# Patient Record
Sex: Female | Born: 1960 | Race: Black or African American | Hispanic: No | Marital: Single | Smoking: Never smoker
Health system: Southern US, Community
[De-identification: ages and names within clinical notes are randomized; demographics above are authoritative.]

## PROBLEM LIST (undated history)

## (undated) DIAGNOSIS — J302 Other seasonal allergic rhinitis: Secondary | ICD-10-CM

## (undated) DIAGNOSIS — J449 Chronic obstructive pulmonary disease, unspecified: Secondary | ICD-10-CM

## (undated) DIAGNOSIS — R7303 Prediabetes: Secondary | ICD-10-CM

## (undated) DIAGNOSIS — K439 Ventral hernia without obstruction or gangrene: Secondary | ICD-10-CM

## (undated) DIAGNOSIS — I10 Essential (primary) hypertension: Secondary | ICD-10-CM

## (undated) HISTORY — PX: BREAST EXCISIONAL BIOPSY: SUR124

---

## 2013-09-16 ENCOUNTER — Emergency Department (HOSPITAL_COMMUNITY): Payer: No Typology Code available for payment source

## 2013-09-16 ENCOUNTER — Emergency Department (HOSPITAL_COMMUNITY)
Admission: EM | Admit: 2013-09-16 | Discharge: 2013-09-16 | Disposition: A | Discharge status: Home / Self Care | Payer: No Typology Code available for payment source | Attending: Emergency Medicine | Admitting: Emergency Medicine

## 2013-09-16 ENCOUNTER — Encounter (HOSPITAL_COMMUNITY): Payer: Self-pay | Admitting: Emergency Medicine

## 2013-09-16 DIAGNOSIS — Y9389 Activity, other specified: Secondary | ICD-10-CM | POA: Insufficient documentation

## 2013-09-16 DIAGNOSIS — M549 Dorsalgia, unspecified: Secondary | ICD-10-CM

## 2013-09-16 DIAGNOSIS — S46909A Unspecified injury of unspecified muscle, fascia and tendon at shoulder and upper arm level, unspecified arm, initial encounter: Secondary | ICD-10-CM | POA: Insufficient documentation

## 2013-09-16 DIAGNOSIS — Z79899 Other long term (current) drug therapy: Secondary | ICD-10-CM | POA: Insufficient documentation

## 2013-09-16 DIAGNOSIS — M25511 Pain in right shoulder: Secondary | ICD-10-CM

## 2013-09-16 DIAGNOSIS — S4980XA Other specified injuries of shoulder and upper arm, unspecified arm, initial encounter: Secondary | ICD-10-CM | POA: Insufficient documentation

## 2013-09-16 DIAGNOSIS — Y92410 Unspecified street and highway as the place of occurrence of the external cause: Secondary | ICD-10-CM | POA: Insufficient documentation

## 2013-09-16 DIAGNOSIS — IMO0002 Reserved for concepts with insufficient information to code with codable children: Secondary | ICD-10-CM | POA: Insufficient documentation

## 2013-09-16 MED ORDER — DIAZEPAM 5 MG PO TABS: 5.0000 mg | ORAL_TABLET | Freq: Two times a day (BID) | ORAL | Status: DC

## 2013-09-16 MED ORDER — IBUPROFEN 800 MG PO TABS
800.0000 mg | ORAL_TABLET | Freq: Once | ORAL | Status: AC
  Administered 2013-09-16: 800 mg via ORAL
  Filled 2013-09-16: qty 1

## 2013-09-16 MED ORDER — DIAZEPAM 5 MG PO TABS
5.0000 mg | ORAL_TABLET | Freq: Once | ORAL | Status: AC
  Administered 2013-09-16: 5 mg via ORAL
  Filled 2013-09-16: qty 1
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
## 2013-09-17 NOTE — ED Provider Notes (Signed)
Medical screening examination/treatment/procedure(s) were performed by non-physician practitioner and as supervising physician I was immediately available for consultation/collaboration.  EKG Interpretation   None         Junius Argyle, MD 09/17/13 [phone]

## 2014-02-22 ENCOUNTER — Emergency Department: Payer: Self-pay | Admitting: Emergency Medicine

## 2014-11-18 ENCOUNTER — Emergency Department: Payer: Self-pay | Admitting: Emergency Medicine

## 2014-11-18 LAB — BASIC METABOLIC PANEL
Anion Gap: 5 — ABNORMAL LOW (ref 7–16)
BUN: 10 mg/dL (ref 7–18)
Calcium, Total: 9.3 mg/dL (ref 8.5–10.1)
Chloride: 103 mmol/L (ref 98–107)
Co2: 30 mmol/L (ref 21–32)
Creatinine: 0.78 mg/dL (ref 0.60–1.30)
EGFR (African American): >60
Glucose: 112 mg/dL — ABNORMAL HIGH (ref 65–99)
OSMOLALITY: 275 (ref 275–301)
Potassium: 3.5 mmol/L (ref 3.5–5.1)
SODIUM: 138 mmol/L (ref 136–145)

## 2014-11-18 LAB — CBC
HCT: 40.5 % (ref 35.0–47.0)
HGB: 13.6 g/dL (ref 12.0–16.0)
MCH: 30.2 pg (ref 26.0–34.0)
MCHC: 33.5 g/dL (ref 32.0–36.0)
MCV: 90 fL (ref 80–100)
PLATELETS: 360 10*3/uL (ref 150–440)
RBC: 4.5 10*6/uL (ref 3.80–5.20)
RDW: 13.4 % (ref 11.5–14.5)
WBC: 8.9 10*3/uL (ref 3.6–11.0)

## 2014-11-18 LAB — TROPONIN I

## 2015-04-26 ENCOUNTER — Emergency Department
Admission: EM | Admit: 2015-04-26 | Discharge: 2015-04-26 | Disposition: A | Discharge status: Home / Self Care | Payer: Self-pay | Attending: Emergency Medicine | Admitting: Emergency Medicine

## 2015-04-26 ENCOUNTER — Other Ambulatory Visit: Payer: Self-pay

## 2015-04-26 ENCOUNTER — Encounter: Payer: Self-pay | Admitting: Emergency Medicine

## 2015-04-26 DIAGNOSIS — I10 Essential (primary) hypertension: Secondary | ICD-10-CM | POA: Insufficient documentation

## 2015-04-26 DIAGNOSIS — J069 Acute upper respiratory infection, unspecified: Secondary | ICD-10-CM | POA: Insufficient documentation

## 2015-04-26 DIAGNOSIS — Z79899 Other long term (current) drug therapy: Secondary | ICD-10-CM | POA: Insufficient documentation

## 2015-04-26 LAB — CBC
HCT: 42 % (ref 35.0–47.0)
Hemoglobin: 14 g/dL (ref 12.0–16.0)
MCH: 30 pg (ref 26.0–34.0)
MCHC: 33.3 g/dL (ref 32.0–36.0)
MCV: 90 fL (ref 80.0–100.0)
PLATELETS: 351 10*3/uL (ref 150–440)
RBC: 4.66 MIL/uL (ref 3.80–5.20)
RDW: 13.5 % (ref 11.5–14.5)
WBC: 7.4 10*3/uL (ref 3.6–11.0)

## 2015-04-26 LAB — BASIC METABOLIC PANEL
Anion gap: 7 (ref 5–15)
BUN: 10 mg/dL (ref 6–20)
CHLORIDE: 104 mmol/L (ref 101–111)
CO2: 28 mmol/L (ref 22–32)
CREATININE: 0.72 mg/dL (ref 0.44–1.00)
Calcium: 9.5 mg/dL (ref 8.9–10.3)
GLUCOSE: 110 mg/dL — AB (ref 65–99)
POTASSIUM: 4.1 mmol/L (ref 3.5–5.1)
SODIUM: 139 mmol/L (ref 135–145)

## 2015-04-26 LAB — TROPONIN I

## 2015-04-26 MED ORDER — FLUTICASONE PROPIONATE 50 MCG/ACT NA SUSP: 2.0000 | Freq: Every day | NASAL | Status: DC

## 2015-04-26 MED ORDER — HYDROCHLOROTHIAZIDE 12.5 MG PO CAPS
12.5000 mg | ORAL_CAPSULE | Freq: Once | ORAL | Status: AC
Start: 2015-04-26 — End: 2015-04-26
  Administered 2015-04-26: 12.5 mg via ORAL
  Filled 2015-04-26: qty 1

## 2015-04-26 MED ORDER — HYDROCHLOROTHIAZIDE 12.5 MG PO TABS: 12.5000 mg | ORAL_TABLET | Freq: Every day | ORAL | Status: DC

## 2015-04-26 NOTE — ED Notes (Signed)
Pt states at work today while standing she got dizzy.  Also c/o sneezing a lot today and dry non productive cough.

## 2015-04-26 NOTE — ED Notes (Signed)
Lab notified to add troponin to blood work sent.

## 2015-04-26 NOTE — ED Notes (Signed)
Pt presents with dizziness started this am around 8am while she was at work. Pt denies any pain at this time.

## 2015-04-26 NOTE — ED Provider Notes (Signed)
[facility] Emergency Department Provider Note  ____________________________________________  Time seen: Approximately 2 PM  I have reviewed the triage vital signs and the nursing notes.   HISTORY  Chief Complaint Dizziness    HPI Colleen CanLeaverta Degregorio is a 54 y.o. female without previous medical history who presents with weakness all over and a runny nose starting at about 8 AM this morning. She said she did not sleep well last night and then woke up with worsening of her runny nose. She says what works standing up she became weak but not dizzy. Despite this documented in the triage notes the patient denies any dizziness but said she had to sit herself down to prevent herself from passing out. She denies any chest pain or shortness of breath at this time. No nausea or vomiting. Says she did not eat any breakfast before work this morning but that is typical for her. She said the episode lasted about an hour until she got home.Did have one episode of diaphoresis with this which also has resolved. Says that she feels much better right now has been ambulatory without any dizziness or weakness. Does not have a primary care doctor in the last time she was in the emergency department her blood pressure was up. Not taking any blood pressure medications.   History reviewed. No pertinent past medical history.  There are no active problems to display for this patient.   History reviewed. No pertinent past surgical history.  Current Outpatient Rx  Name  Route  Sig  Dispense  Refill  . fluticasone (FLONASE) 50 MCG/ACT nasal spray   Each Nare   Place 2 sprays into both nostrils daily.   9.9 g   0   . hydrochlorothiazide (HYDRODIURIL) 12.5 MG tablet   Oral   Take 1 tablet (12.5 mg total) by mouth daily.   30 tablet   0     Allergies Review of patient's allergies indicates no known allergies.  No family history on file.  Social History History  Substance Use Topics   . Smoking status: Never Smoker   . Smokeless tobacco: Not on file  . Alcohol Use: No    Review of Systems Constitutional: No fever/chills Eyes: No visual changes. ENT: No sore throat. Cardiovascular: Denies chest pain. Respiratory: Denies shortness of breath. Gastrointestinal: No abdominal pain.  No nausea, no vomiting.  No diarrhea.  No constipation. Genitourinary: Negative for dysuria. Musculoskeletal: Negative for back pain. Skin: Negative for rash. Neurological: Negative for headaches, focal weakness or numbness.  10-point ROS otherwise negative.  ____________________________________________   PHYSICAL EXAM:  VITAL SIGNS: ED Triage Vitals  Enc Vitals Group     BP 04/26/15 1033 192/89 mmHg     Pulse Rate 04/26/15 1033 78     Resp 04/26/15 1033 18     Temp 04/26/15 1033 98 F (36.7 C)     Temp Source 04/26/15 1033 Oral     SpO2 04/26/15 1033 100 %     Weight 04/26/15 1033 165 lb (74.844 kg)     Height 04/26/15 1033 5\' 4"  (1.626 m)     Head Cir --      Peak Flow --      Pain Score 04/26/15 1034 0     Pain Loc --      Pain Edu? --      Excl. in GC? --     Constitutional: Alert and oriented. Well appearing and in no acute distress. Eyes: Conjunctivae are normal. PERRL.  EOMI. Head: Atraumatic. Nose: Bilateral rhinorrhea with swollen and erythematous mucosa. No tenderness palpation over the sinuses. Mouth/Throat: Mucous membranes are moist.  Oropharynx non-erythematous. Neck: No stridor.   Cardiovascular: Normal rate, regular rhythm. Grossly normal heart sounds.  Good peripheral circulation. Respiratory: Normal respiratory effort.  No retractions. Lungs CTAB. Gastrointestinal: Soft and nontender. No distention. No abdominal bruits. No CVA tenderness. Musculoskeletal: No lower extremity tenderness nor edema.  No joint effusions. Neurologic:  Normal speech and language. No gross focal neurologic deficits are appreciated. No gait instability. Skin:  Skin is warm,  dry and intact. No rash noted. Psychiatric: Mood and affect are normal. Speech and behavior are normal.  ____________________________________________   LABS (all labs ordered are listed, but only abnormal results are displayed)  Labs Reviewed  BASIC METABOLIC PANEL - Abnormal; Notable for the following:    Glucose, Bld 110 (*)    All other components within normal limits  CBC  TROPONIN I   ____________________________________________  EKG  ED ECG REPORT I, Arelia Longest, the attending physician, personally viewed and interpreted this ECG.   Date: 04/26/2015  EKG Time: 1036  Rate: 80  Rhythm: normal sinus rhythm  Axis: Normal axis  Intervals:none  ST&T Change: No ST elevations or depressions. No abnormal T-wave inversions.  ____________________________________________  RADIOLOGY   ____________________________________________   PROCEDURES    ____________________________________________   INITIAL IMPRESSION / ASSESSMENT AND PLAN / ED COURSE  Pertinent labs & imaging results that were available during my care of the patient were reviewed by me and considered in my medical decision making (see chart for details).  ----------------------------------------- 9:35 PM on 04/26/2015 -----------------------------------------  Patient remained at baseline neurologic status throughout hospital stay. Feel that her presentation is likely secondary to having an upper respiratory infection however, she appears to have chronic hypertension without follow-up with a primary care doctor. I will start her on HCTZ has now patient. I counseled both her and her family that she will need to follow-up for repeat blood pressure measurements to determine the efficacy of her blood pressure medication. She will likely need further adjustments and possible additional medications in the future. Her workup was reassuring with normal kidney function as well as an undetectable troponin. She  denied any dizziness and therefore it is unlikely that this represents an acute CVA especially with the patient's respiratory symptoms. We will treat symptomatically and discharged home. ____________________________________________   FINAL CLINICAL IMPRESSION(S) / ED DIAGNOSES  Final diagnoses:  Uncontrolled hypertension  Upper respiratory infection      Myrna Blazer, MD 04/26/15 2137

## 2016-02-02 ENCOUNTER — Encounter: Payer: Self-pay | Admitting: Emergency Medicine

## 2016-02-02 ENCOUNTER — Ambulatory Visit
Admission: EM | Admit: 2016-02-02 | Discharge: 2016-02-02 | Disposition: A | Discharge status: Home / Self Care | Payer: Self-pay | Attending: Family Medicine | Admitting: Family Medicine

## 2016-02-02 ENCOUNTER — Ambulatory Visit (INDEPENDENT_AMBULATORY_CARE_PROVIDER_SITE_OTHER): Payer: Self-pay

## 2016-02-02 DIAGNOSIS — M109 Gout, unspecified: Secondary | ICD-10-CM

## 2016-02-02 DIAGNOSIS — M10072 Idiopathic gout, left ankle and foot: Secondary | ICD-10-CM

## 2016-02-02 DIAGNOSIS — I10 Essential (primary) hypertension: Secondary | ICD-10-CM

## 2016-02-02 MED ORDER — NAPROXEN 500 MG PO TABS: 500.0000 mg | ORAL_TABLET | Freq: Two times a day (BID) | ORAL | Status: DC

## 2016-02-02 MED ORDER — OMEPRAZOLE 20 MG PO CPDR: 20.0000 mg | DELAYED_RELEASE_CAPSULE | Freq: Every day | ORAL | Status: DC

## 2016-02-02 NOTE — Discharge Instructions (Signed)
Gout Gout is when your joints become red, sore, and swell (inflamed). This is caused by the buildup of uric acid crystals in the joints. Uric acid is a chemical that is normally in the blood. If the level of uric acid gets too high in the blood, these crystals form in your joints and tissues. Over time, these crystals can form into masses near the joints and tissues. These masses can destroy bone and cause the bone to look misshapen (deformed). HOME CARE   Do not take aspirin for pain.  Only take medicine as told by your doctor.  Rest the joint as much as you can. When in bed, keep sheets and blankets off painful areas.  Keep the sore joints raised (elevated).  Put warm or cold packs on painful joints. Use of warm or cold packs depends on which works best for you.  Use crutches if the painful joint is in your leg.  Drink enough fluids to keep your pee (urine) clear or pale yellow. Limit alcohol, sugary drinks, and drinks with fructose in them.  Follow your diet instructions. Pay careful attention to how much protein you eat. Include fruits, vegetables, whole grains, and fat-free or low-fat milk products in your daily diet. Talk to your doctor or dietitian about the use of coffee, vitamin C, and cherries. These may help lower uric acid levels.  Keep a healthy body weight. GET HELP RIGHT AWAY IF:   You have watery poop (diarrhea), throw up (vomit), or have any side effects from medicines.  You do not feel better in 24 hours, or you are getting worse.  Your joint becomes suddenly more tender, and you have chills or a fever. MAKE SURE YOU:   Understand these instructions.  Will watch your condition.  Will get help right away if you are not doing well or get worse.   This information is not intended to replace advice given to you by your health care provider. Make sure you discuss any questions you have with your health care provider.   Document Released: 07/09/2008 Document Revised:  10/21/2014 Document Reviewed: 05/13/2012 Elsevier Interactive Patient Education 2016 [employer].  Hypertension Hypertension is another name for high blood pressure. High blood pressure forces your heart to work harder to pump blood. A blood pressure reading has two numbers, which includes a higher number over a lower number (example: 110/72). HOME CARE   Have your blood pressure rechecked by your doctor.  Only take medicine as told by your doctor. Follow the directions carefully. The medicine does not work as well if you skip doses. Skipping doses also puts you at risk for problems.  Do not smoke.  Monitor your blood pressure at home as told by your doctor. GET HELP IF:  You think you are having a reaction to the medicine you are taking.  You have repeat headaches or feel dizzy.  You have puffiness (swelling) in your ankles.  You have trouble with your vision. GET HELP RIGHT AWAY IF:   You get a very bad headache and are confused.  You feel weak, numb, or faint.  You get chest or belly (abdominal) pain.  You throw up (vomit).  You cannot breathe very well. MAKE SURE YOU:   Understand these instructions.  Will watch your condition.  Will get help right away if you are not doing well or get worse.   This information is not intended to replace advice given to you by your health care provider. Make sure you  discuss any questions you have with your health care provider.   Document Released: 03/18/2008 Document Revised: 10/05/2013 Document Reviewed: 07/23/2013 Elsevier Interactive Patient Education 2016 [employer] Inc.  Low-Purine Diet Purines are compounds that affect the level of uric acid in your body. A low-purine diet is a diet that is low in purines. Eating a low-purine diet can prevent the level of uric acid in your body from getting too high and causing gout or kidney stones or both. WHAT DO I NEED TO KNOW ABOUT THIS DIET?  Choose low-purine foods. Examples of  low-purine foods are listed in the next section.  Drink plenty of fluids, especially water. Fluids can help remove uric acid from your body. Try to drink 8-16 cups (1.9-3.8 L) a day.  Limit foods high in fat, especially saturated fat, as fat makes it harder for the body to get rid of uric acid. Foods high in saturated fat include pizza, cheese, ice cream, whole milk, fried foods, and gravies. Choose foods that are lower in fat and lean sources of protein. Use olive oil when cooking as it contains healthy fats that are not high in saturated fat.  Limit alcohol. Alcohol interferes with the elimination of uric acid from your body. If you are having a gout attack, avoid all alcohol.  Keep in mind that different people's bodies react differently to different foods. You will probably learn over time which foods do or do not affect you. If you discover that a food tends to cause your gout to flare up, avoid eating that food. You can more freely enjoy foods that do not cause problems. If you have any questions about a food item, talk to your dietitian or health care provider. WHICH FOODS ARE LOW, MODERATE, AND HIGH IN PURINES? The following is a list of foods that are low, moderate, and high in purines. You can eat any amount of the foods that are low in purines. You may be able to have small amounts of foods that are moderate in purines. Ask your health care provider how much of a food moderate in purines you can have. Avoid foods high in purines. Grains  Foods low in purines: Enriched white bread, pasta, rice, cake, cornbread, popcorn.  Foods moderate in purines: Whole-grain breads and cereals, wheat germ, bran, oatmeal. Uncooked oatmeal. Dry wheat bran or wheat germ.  Foods high in purines: Pancakes, [place] toast, biscuits, muffins. Vegetables  Foods low in purines: All vegetables, except those that are moderate in purines.  Foods moderate in purines: Asparagus, cauliflower, spinach, mushrooms,  green peas. Fruits  All fruits are low in purines. Meats and other Protein Foods  Foods low in purines: Eggs, nuts, peanut butter.  Foods moderate in purines: 80-90% lean beef, lamb, veal, pork, poultry, fish, eggs, peanut butter, nuts. Crab, lobster, oysters, and shrimp. Cooked dried beans, peas, and lentils.  Foods high in purines: Anchovies, sardines, herring, mussels, tuna, codfish, scallops, trout, and haddock. Sheila Parsons. Organ meats (such as liver or kidney). Tripe. Game meat. Goose. Sweetbreads. Dairy  All dairy foods are low in purines. Low-fat and fat-free dairy products are best because they are low in saturated fat. Beverages  Drinks low in purines: Water, carbonated beverages, tea, coffee, cocoa.  Drinks moderate in purines: Soft drinks and other drinks sweetened with high-fructose corn syrup. Juices. To find whether a food or drink is sweetened with high-fructose corn syrup, look at the ingredients list.  Drinks high in purines: Alcoholic beverages (such as beer). Condiments  Foods  low in purines: Salt, herbs, olives, pickles, relishes, vinegar.  Foods moderate in purines: Butter, margarine, oils, mayonnaise. Fats and Oils  Foods low in purines: All types, except gravies and sauces made with meat.  Foods high in purines: Gravies and sauces made with meat. Other Foods  Foods low in purines: Sugars, sweets, gelatin. Cake. Soups made without meat.  Foods moderate in purines: Meat-based or fish-based soups, broths, or bouillons. Foods and drinks sweetened with high-fructose corn syrup.  Foods high in purines: High-fat desserts (such as ice cream, cookies, cakes, pies, doughnuts, and chocolate). Contact your dietitian for more information on foods that are not listed here.   This information is not intended to replace advice given to you by your health care provider. Make sure you discuss any questions you have with your health care provider.   Document Released:  01/25/2011 Document Revised: 10/05/2013 Document Reviewed: 09/06/2013 Elsevier Interactive Patient Education 2016 [employer]  Managing Your High Blood Pressure Blood pressure is a measurement of how forceful your blood is pressing against the walls of the arteries. Arteries are muscular tubes within the circulatory system. Blood pressure does not stay the same. Blood pressure rises when you are active, excited, or nervous; and it lowers during sleep and relaxation. If the numbers measuring your blood pressure stay above normal most of the time, you are at risk for health problems. High blood pressure (hypertension) is a long-term (chronic) condition in which blood pressure is elevated. A blood pressure reading is recorded as two numbers, such as 120 over 80 (or 120/80). The first, higher number is called the systolic pressure. It is a measure of the pressure in your arteries as the heart beats. The second, lower number is called the diastolic pressure. It is a measure of the pressure in your arteries as the heart relaxes between beats.  Keeping your blood pressure in a normal range is important to your overall health and prevention of health problems, such as heart disease and stroke. When your blood pressure is uncontrolled, your heart has to work harder than normal. High blood pressure is a very common condition in adults because blood pressure tends to rise with age. Men and women are equally likely to have hypertension but at different times in life. Before age 54, men are more likely to have hypertension. After 54 years of age, women are more likely to have it. Hypertension is especially common in African Americans. This condition often has no signs or symptoms. The cause of the condition is usually not known. Your caregiver can help you come up with a plan to keep your blood pressure in a normal, healthy range. BLOOD PRESSURE STAGES Blood pressure is classified into four stages: normal,  prehypertension, stage 1, and stage 2. Your blood pressure reading will be used to determine what type of treatment, if any, is necessary. Appropriate treatment options are tied to these four stages:  Normal  Systolic pressure (mm Hg): below 120.  Diastolic pressure (mm Hg): below 80. Prehypertension  Systolic pressure (mm Hg): 120 to 139.  Diastolic pressure (mm Hg): 80 to 89. Stage1  Systolic pressure (mm Hg): 140 to 159.  Diastolic pressure (mm Hg): 90 to 99. Stage2  Systolic pressure (mm Hg): 160 or above.  Diastolic pressure (mm Hg): 100 or above. RISKS RELATED TO HIGH BLOOD PRESSURE Managing your blood pressure is an important responsibility. Uncontrolled high blood pressure can lead to:  A heart attack.  A stroke.  A weakened blood vessel (  aneurysm).  Heart failure.  Kidney damage.  Eye damage.  Metabolic syndrome.  Memory and concentration problems. HOW TO MANAGE YOUR BLOOD PRESSURE Blood pressure can be managed effectively with lifestyle changes and medicines (if needed). Your caregiver will help you come up with a plan to bring your blood pressure within a normal range. Your plan should include the following: Education  Read all information provided by your caregivers about how to control blood pressure.  Educate yourself on the latest guidelines and treatment recommendations. New research is always being done to further define the risks and treatments for high blood pressure. Lifestylechanges  Control your weight.  Avoid smoking.  Stay physically active.  Reduce the amount of salt in your diet.  Reduce stress.  Control any chronic conditions, such as high cholesterol or diabetes.  Reduce your alcohol intake. Medicines  Several medicines (antihypertensive medicines) are available, if needed, to bring blood pressure within a normal range. Communication  Review all the medicines you take with your caregiver because there may be side effects  or interactions.  Talk with your caregiver about your diet, exercise habits, and other lifestyle factors that may be contributing to high blood pressure.  See your caregiver regularly. Your caregiver can help you create and adjust your plan for managing high blood pressure. RECOMMENDATIONS FOR TREATMENT AND FOLLOW-UP  The following recommendations are based on current guidelines for managing high blood pressure in nonpregnant adults. Use these recommendations to identify the proper follow-up period or treatment option based on your blood pressure reading. You can discuss these options with your caregiver.  Systolic pressure of 120 to 139 or diastolic pressure of 80 to 89: Follow up with your caregiver as directed.  Systolic pressure of 140 to 160 or diastolic pressure of 90 to 100: Follow up with your caregiver within 2 months.  Systolic pressure above 160 or diastolic pressure above 100: Follow up with your caregiver within 1 month.  Systolic pressure above 180 or diastolic pressure above 110: Consider antihypertensive therapy; follow up with your caregiver within 1 week.  Systolic pressure above 200 or diastolic pressure above 120: Begin antihypertensive therapy; follow up with your caregiver within 1 week.   This information is not intended to replace advice given to you by your health care provider. Make sure you discuss any questions you have with your health care provider.   Document Released: 06/24/2012 Document Reviewed: 06/24/2012 Elsevier Interactive Patient Education [employer]

## 2016-02-02 NOTE — ED Provider Notes (Signed)
CSN: [id number]     Arrival date & time 02/02/16  1907 History   First MD Initiated Contact with Patient 02/02/16 2031     Chief Complaint  Patient presents with  . Ankle Pain   (Consider location/radiation/quality/duration/timing/severity/associated sxs/prior Treatment) HPI   This a 55 year old female sense with left ankle pain with redness and swelling that was noticed on Wednesday 2 days ago. Now has an antalgic gait. Did not have any injury that she remembers. She is afebrile. She has no history of gout. Have a history of hypertension which was treated for 30 days and then stopped her blood pressure today is 164/100.     History reviewed. No pertinent past medical history. History reviewed. No pertinent past surgical history. History reviewed. No pertinent family history. Social History  Substance Use Topics  . Smoking status: Never Smoker   . Smokeless tobacco: None  . Alcohol Use: No   OB History    No data available     Review of Systems  Constitutional: Positive for activity change. Negative for fever, chills and fatigue.  Musculoskeletal: Positive for joint swelling and gait problem.  Skin: Positive for color change.  All other systems reviewed and are negative.   Allergies  Review of patient's allergies indicates no known allergies.  Home Medications   Prior to Admission medications   Medication Sig Start Date End Date Taking? Authorizing Provider  fluticasone (FLONASE) 50 MCG/ACT nasal spray Place 2 sprays into both nostrils daily. 04/26/15 04/25/16  Myrna Blazeravid Matthew Schaevitz, MD  hydrochlorothiazide (HYDRODIURIL) 12.5 MG tablet Take 1 tablet (12.5 mg total) by mouth daily. 04/26/15   Myrna Blazeravid Matthew Schaevitz, MD  naproxen (NAPROSYN) 500 MG tablet Take 1 tablet (500 mg total) by mouth 2 (two) times daily with a meal. 02/02/16   Lutricia FeilWilliam P Roemer, PA-C  omeprazole (PRILOSEC) 20 MG capsule Take 1 capsule (20 mg total) by mouth daily. 02/02/16   Lutricia FeilWilliam P Roemer, PA-C    Meds Ordered and Administered this Visit  Medications - No data to display  BP 164/100 mmHg  Pulse 85  Temp(Src) 98 F (36.7 C) (Oral)  Resp 16  Ht 5\' 5"  (1.651 m)  Wt 175 lb (79.379 kg)  BMI 29.12 kg/m2  SpO2 100% No data found.   Physical Exam  Constitutional: She is oriented to person, place, and time. She appears well-developed and well-nourished. No distress.  HENT:  Head: Normocephalic and atraumatic.  Eyes: Conjunctivae are normal. Pupils are equal, round, and reactive to light.  Neck: Normal range of motion. Neck supple.  Musculoskeletal:  His admission left ankle shows a decreased range of motion. Tenderness and warmth over the medial malleolus mostly but also over the lateral malleolus yearly. There is some erythema present. Calf diameters are equal bilaterally. There is no warmth or tenderness of the calf.  Neurological: She is alert and oriented to person, place, and time.  Skin: Skin is warm and dry. She is not diaphoretic.  Psychiatric: She has a normal mood and affect. Her behavior is normal. Judgment and thought content normal.  Nursing note and vitals reviewed.   ED Course  Procedures (including critical care time)  Labs Review Labs Reviewed - No data to display  Imaging Review Dg Ankle Complete Left  02/02/2016  CLINICAL DATA:  Left lateral ankle pain for 2 days. No known injury. Pain, swelling and erythema. EXAM: LEFT ANKLE COMPLETE - 3+ VIEW COMPARISON:  None. FINDINGS: No fracture or dislocation. The alignment and joint spaces are  maintained. Ankle mortise is preserved. Well corticated fragmentation about the medial malleolus may be enthesopathy or sequela of remote prior injury. No bony destructive change. Plantar calcaneal spur and Achilles tendon enthesophyte. There is mild lateral soft tissue edema. No soft tissue air or radiopaque foreign body. IMPRESSION: Lateral soft tissue edema.  No acute bony abnormality. Electronically Signed   By: Rubye Oaks M.D.   On: 02/02/2016 21:12     Visual Acuity Review  Right Eye Distance:   Left Eye Distance:   Bilateral Distance:    Right Eye Near:   Left Eye Near:    Bilateral Near:         MDM   1. Acute gout of left ankle, unspecified cause   2. Essential hypertension    Discharge Medication List as of 02/02/2016  9:29 PM    START taking these medications   Details  naproxen (NAPROSYN) 500 MG tablet Take 1 tablet (500 mg total) by mouth 2 (two) times daily with a meal., Starting 02/02/2016, Until Discontinued, Print    omeprazole (PRILOSEC) 20 MG capsule Take 1 capsule (20 mg total) by mouth daily., Starting 02/02/2016, Until Discontinued, Print      Plan: 1. Test/x-ray results and diagnosis reviewed with patient 2. rx as per orders; risks, benefits, potential side effects reviewed with patient 3. Recommend supportive treatment with Dietary restrictions. Elevate the foot as much as possible and to protect it with crutches to keep it comfortable. He also give her Prilosec for gastric protection and reminded her that she needs to take the medication only with food. I have advised her that because of her elevated blood pressure , she'll need to come under the  care of a primary care physician. She states she will find one in the [place] area. We need to be followed up as soon as her pressures are elevated. 4. F/u prn if symptoms worsen or don't improve     Lutricia Feil, PA-C 02/02/16 2135

## 2016-02-02 NOTE — ED Notes (Signed)
Left ankle pain, redness and swelling noticed on Wed. Denied injury.

## 2016-11-21 ENCOUNTER — Inpatient Hospital Stay
Admission: EM | Admit: 2016-11-21 | Discharge: 2016-11-22 | DRG: 202 | Disposition: A | Discharge status: Home / Self Care | Payer: Self-pay | Attending: Internal Medicine | Admitting: Internal Medicine

## 2016-11-21 ENCOUNTER — Emergency Department: Payer: Self-pay

## 2016-11-21 ENCOUNTER — Encounter: Payer: Self-pay | Admitting: Emergency Medicine

## 2016-11-21 ENCOUNTER — Inpatient Hospital Stay
Admit: 2016-11-21 | Discharge: 2016-11-21 | Disposition: A | Discharge status: Home / Self Care | Payer: Self-pay | Attending: Internal Medicine | Admitting: Internal Medicine

## 2016-11-21 DIAGNOSIS — Z7951 Long term (current) use of inhaled steroids: Secondary | ICD-10-CM

## 2016-11-21 DIAGNOSIS — Z86718 Personal history of other venous thrombosis and embolism: Secondary | ICD-10-CM

## 2016-11-21 DIAGNOSIS — Z825 Family history of asthma and other chronic lower respiratory diseases: Secondary | ICD-10-CM

## 2016-11-21 DIAGNOSIS — J209 Acute bronchitis, unspecified: Principal | ICD-10-CM | POA: Diagnosis present

## 2016-11-21 DIAGNOSIS — I10 Essential (primary) hypertension: Secondary | ICD-10-CM | POA: Diagnosis present

## 2016-11-21 DIAGNOSIS — R062 Wheezing: Secondary | ICD-10-CM

## 2016-11-21 DIAGNOSIS — J441 Chronic obstructive pulmonary disease with (acute) exacerbation: Secondary | ICD-10-CM | POA: Diagnosis present

## 2016-11-21 DIAGNOSIS — J440 Chronic obstructive pulmonary disease with acute lower respiratory infection: Secondary | ICD-10-CM | POA: Diagnosis present

## 2016-11-21 DIAGNOSIS — Z79899 Other long term (current) drug therapy: Secondary | ICD-10-CM

## 2016-11-21 LAB — CBC WITH DIFFERENTIAL/PLATELET
Basophils Absolute: 0 10*3/uL (ref 0–0.1)
Basophils Relative: 0 %
Eosinophils Absolute: 0.2 10*3/uL (ref 0–0.7)
Eosinophils Relative: 2 %
HEMATOCRIT: 42.3 % (ref 35.0–47.0)
HEMOGLOBIN: 14.3 g/dL (ref 12.0–16.0)
Lymphocytes Relative: 5 %
Lymphs Abs: 0.7 10*3/uL — ABNORMAL LOW (ref 1.0–3.6)
MCH: 30.4 pg (ref 26.0–34.0)
MCHC: 33.9 g/dL (ref 32.0–36.0)
MCV: 89.8 fL (ref 80.0–100.0)
MONOS PCT: 3 %
Monocytes Absolute: 0.4 10*3/uL (ref 0.2–0.9)
NEUTROS ABS: 10.9 10*3/uL — AB (ref 1.4–6.5)
Neutrophils Relative %: 90 %
Platelets: 351 10*3/uL (ref 150–440)
RBC: 4.71 MIL/uL (ref 3.80–5.20)
RDW: 13.2 % (ref 11.5–14.5)
WBC: 12.2 10*3/uL — ABNORMAL HIGH (ref 3.6–11.0)

## 2016-11-21 LAB — BASIC METABOLIC PANEL
Anion gap: 8 (ref 5–15)
BUN: 9 mg/dL (ref 6–20)
CALCIUM: 9.4 mg/dL (ref 8.9–10.3)
CHLORIDE: 103 mmol/L (ref 101–111)
CO2: 26 mmol/L (ref 22–32)
Creatinine, Ser: 0.72 mg/dL (ref 0.44–1.00)
GFR calc non Af Amer: >60 mL/min (ref >60)
Glucose, Bld: 125 mg/dL — ABNORMAL HIGH (ref 65–99)
Potassium: 3.7 mmol/L (ref 3.5–5.1)
Sodium: 137 mmol/L (ref 135–145)

## 2016-11-21 LAB — TSH: TSH: 1.36 u[IU]/mL (ref 0.350–4.500)

## 2016-11-21 LAB — INFLUENZA PANEL BY PCR (TYPE A & B)
INFLBPCR: NEGATIVE
Influenza A By PCR: NEGATIVE

## 2016-11-21 LAB — MAGNESIUM: Magnesium: 2 mg/dL (ref 1.7–2.4)

## 2016-11-21 LAB — TROPONIN I: Troponin I: <0.03 ng/mL (ref <0.03)

## 2016-11-21 LAB — BRAIN NATRIURETIC PEPTIDE: B NATRIURETIC PEPTIDE 5: 26 pg/mL (ref 0.0–100.0)

## 2016-11-21 MED ORDER — IPRATROPIUM-ALBUTEROL 0.5-2.5 (3) MG/3ML IN SOLN
3.0000 mL | Freq: Four times a day (QID) | RESPIRATORY_TRACT | Status: DC
  Administered 2016-11-21 – 2016-11-22 (×3): 3 mL via RESPIRATORY_TRACT
  Filled 2016-11-21 (×3): qty 3

## 2016-11-21 MED ORDER — METOPROLOL TARTRATE 25 MG PO TABS
25.0000 mg | ORAL_TABLET | Freq: Two times a day (BID) | ORAL | Status: DC
  Administered 2016-11-21: 25 mg via ORAL
  Filled 2016-11-21: qty 1

## 2016-11-21 MED ORDER — DIPHENHYDRAMINE HCL 25 MG PO CAPS
25.0000 mg | ORAL_CAPSULE | Freq: Four times a day (QID) | ORAL | Status: DC | PRN
  Administered 2016-11-21: 25 mg via ORAL
  Filled 2016-11-21: qty 1

## 2016-11-21 MED ORDER — ONDANSETRON HCL 4 MG PO TABS: 4.0000 mg | ORAL_TABLET | Freq: Four times a day (QID) | ORAL | Status: DC | PRN

## 2016-11-21 MED ORDER — PANTOPRAZOLE SODIUM 40 MG PO TBEC
40.0000 mg | DELAYED_RELEASE_TABLET | Freq: Every day | ORAL | Status: DC
  Administered 2016-11-22: 40 mg via ORAL
  Filled 2016-11-21: qty 1

## 2016-11-21 MED ORDER — HYDROCHLOROTHIAZIDE 25 MG PO TABS
12.5000 mg | ORAL_TABLET | Freq: Every day | ORAL | Status: DC
  Administered 2016-11-21 – 2016-11-22 (×2): 12.5 mg via ORAL
  Filled 2016-11-21 (×2): qty 1

## 2016-11-21 MED ORDER — IPRATROPIUM BROMIDE 0.02 % IN SOLN
0.5000 mg | Freq: Once | RESPIRATORY_TRACT | Status: AC
  Administered 2016-11-21: 0.5 mg via RESPIRATORY_TRACT
  Filled 2016-11-21: qty 2.5

## 2016-11-21 MED ORDER — ALBUTEROL SULFATE (2.5 MG/3ML) 0.083% IN NEBU
5.0000 mg | INHALATION_SOLUTION | Freq: Once | RESPIRATORY_TRACT | Status: AC
  Administered 2016-11-21: 5 mg via RESPIRATORY_TRACT
  Filled 2016-11-21: qty 6

## 2016-11-21 MED ORDER — LEVOFLOXACIN IN D5W 750 MG/150ML IV SOLN
750.0000 mg | Freq: Once | INTRAVENOUS | Status: AC
  Administered 2016-11-21: 750 mg via INTRAVENOUS
  Filled 2016-11-21: qty 150

## 2016-11-21 MED ORDER — ONDANSETRON HCL 4 MG/2ML IJ SOLN: 4.0000 mg | Freq: Four times a day (QID) | INTRAMUSCULAR | Status: DC | PRN

## 2016-11-21 MED ORDER — METHYLPREDNISOLONE SODIUM SUCC 125 MG IJ SOLR
60.0000 mg | Freq: Four times a day (QID) | INTRAMUSCULAR | Status: DC
  Filled 2016-11-21: qty 2

## 2016-11-21 MED ORDER — SODIUM CHLORIDE 0.9 % IV BOLUS (SEPSIS)
1000.0000 mL | Freq: Once | INTRAVENOUS | Status: AC
Start: 2016-11-21 — End: 2016-11-21
  Administered 2016-11-21: 1000 mL via INTRAVENOUS

## 2016-11-21 MED ORDER — METHYLPREDNISOLONE SODIUM SUCC 125 MG IJ SOLR
60.0000 mg | Freq: Four times a day (QID) | INTRAMUSCULAR | Status: DC
  Administered 2016-11-21 – 2016-11-22 (×4): 60 mg via INTRAVENOUS
  Filled 2016-11-21 (×3): qty 2

## 2016-11-21 MED ORDER — PREDNISONE 20 MG PO TABS
60.0000 mg | ORAL_TABLET | Freq: Once | ORAL | Status: AC
  Administered 2016-11-21: 60 mg via ORAL
  Filled 2016-11-21: qty 3

## 2016-11-21 MED ORDER — ACETAMINOPHEN 650 MG RE SUPP: 650.0000 mg | Freq: Four times a day (QID) | RECTAL | Status: DC | PRN

## 2016-11-21 MED ORDER — BISACODYL 5 MG PO TBEC
5.0000 mg | DELAYED_RELEASE_TABLET | Freq: Every day | ORAL | Status: DC | PRN
  Filled 2016-11-21: qty 1

## 2016-11-21 MED ORDER — GUAIFENESIN-DM 100-10 MG/5ML PO SYRP
5.0000 mL | ORAL_SOLUTION | ORAL | Status: DC | PRN
  Administered 2016-11-21 – 2016-11-22 (×2): 5 mL via ORAL
  Filled 2016-11-21 (×2): qty 5

## 2016-11-21 MED ORDER — ENOXAPARIN SODIUM 40 MG/0.4ML ~~LOC~~ SOLN
40.0000 mg | SUBCUTANEOUS | Status: DC
  Administered 2016-11-21: 40 mg via SUBCUTANEOUS
  Filled 2016-11-21: qty 0.4

## 2016-11-21 MED ORDER — SODIUM CHLORIDE 0.9 % IV SOLN
INTRAVENOUS | Status: DC
  Administered 2016-11-21 – 2016-11-22 (×2): via INTRAVENOUS

## 2016-11-21 MED ORDER — ALBUTEROL SULFATE (2.5 MG/3ML) 0.083% IN NEBU
INHALATION_SOLUTION | RESPIRATORY_TRACT | Status: AC
  Administered 2016-11-21: 5 mg via RESPIRATORY_TRACT
  Filled 2016-11-21: qty 6

## 2016-11-21 MED ORDER — ALBUTEROL SULFATE (2.5 MG/3ML) 0.083% IN NEBU
5.0000 mg | INHALATION_SOLUTION | Freq: Once | RESPIRATORY_TRACT | Status: AC
Start: 2016-11-21 — End: 2016-11-21
  Administered 2016-11-21: 5 mg via RESPIRATORY_TRACT

## 2016-11-21 MED ORDER — ACETAMINOPHEN 325 MG PO TABS
650.0000 mg | ORAL_TABLET | Freq: Four times a day (QID) | ORAL | Status: DC | PRN
  Administered 2016-11-21: 650 mg via ORAL
  Filled 2016-11-21: qty 2

## 2016-11-21 MED ORDER — IOPAMIDOL (ISOVUE-370) INJECTION 76%
75.0000 mL | Freq: Once | INTRAVENOUS | Status: AC | PRN
  Administered 2016-11-21: 75 mL via INTRAVENOUS

## 2016-11-21 MED ORDER — DILTIAZEM HCL 60 MG PO TABS
60.0000 mg | ORAL_TABLET | Freq: Three times a day (TID) | ORAL | Status: DC
  Administered 2016-11-21 – 2016-11-22 (×4): 60 mg via ORAL
  Filled 2016-11-21 (×5): qty 1

## 2016-11-21 MED ORDER — TRAZODONE HCL 50 MG PO TABS: 25.0000 mg | ORAL_TABLET | Freq: Every evening | ORAL | Status: DC | PRN

## 2016-11-21 MED ORDER — DOCUSATE SODIUM 100 MG PO CAPS
100.0000 mg | ORAL_CAPSULE | Freq: Two times a day (BID) | ORAL | Status: DC
  Administered 2016-11-21 – 2016-11-22 (×2): 100 mg via ORAL
  Filled 2016-11-21 (×2): qty 1

## 2016-11-21 MED ORDER — METOPROLOL TARTRATE 25 MG PO TABS
25.0000 mg | ORAL_TABLET | Freq: Two times a day (BID) | ORAL | Status: DC
  Administered 2016-11-22 (×2): 25 mg via ORAL
  Filled 2016-11-21 (×2): qty 1

## 2016-11-21 NOTE — Progress Notes (Signed)
Patient admitted today from home. Complaining of shortness of breath on exertion and cough. Expiratory wheezes per auscultation. Complaining of back pain from lying in the bed in the ER all day. Medicated with tylenol with some relief. Patient has ambulated to the bathroom, but gets very short of breath after returning to bed. NSR on tele. Independent. Will continue to monitor.

## 2016-11-21 NOTE — ED Notes (Signed)
Pt given meal tray.

## 2016-11-21 NOTE — ED Triage Notes (Addendum)
Patient ambulatory to triage with steady gait, without difficulty or distress noted; pt [facility]prod [facility]; denies fever, denies pain; pt taken to room 24 by Wynona Caneshristine, RN for further evaluation

## 2016-11-21 NOTE — ED Provider Notes (Addendum)
[facility] Emergency Department Provider Note  ____________________________________________   I have reviewed the triage vital signs and the nursing notes.   HISTORY  Chief Complaint Cough    HPI Sheila Parsons is a 56 y.o. female who presents today complaining of cough and wheeze. She's been feeling 6 for 2 days. Runny nose. Had some chills but no fever. Denies any nausea or vomiting. Denies chest pain. Cough is occasionally productive. Sheila Parsons is a nonsmoker. States she has no other medical problems. She does have a history of hypertension but did not take her meds this morning   History reviewed. No pertinent past medical history.  There are no active problems to display for this patient.   History reviewed. No pertinent surgical history.  Prior to Admission medications   Medication Sig Start Date End Date Taking? Authorizing Provider  fluticasone (FLONASE) 50 MCG/ACT nasal spray Place 2 sprays into both nostrils daily. 04/26/15 04/25/16  Myrna Blazer, MD  hydrochlorothiazide (HYDRODIURIL) 12.5 MG tablet Take 1 tablet (12.5 mg total) by mouth daily. 04/26/15   Myrna Blazer, MD  naproxen (NAPROSYN) 500 MG tablet Take 1 tablet (500 mg total) by mouth 2 (two) times daily with a meal. 02/02/16   Lutricia Feil, PA-C  omeprazole (PRILOSEC) 20 MG capsule Take 1 capsule (20 mg total) by mouth daily. 02/02/16   Lutricia Feil, PA-C    Allergies Patient has no known allergies.  Family History  Problem Relation Age of Onset  . Healthy Mother   . COPD Father     Social History Social History  Substance Use Topics  . Smoking status: Never Smoker  . Smokeless tobacco: Never Used  . Alcohol use No    Review of Systems Constitutional: See history of present illness Eyes: No visual changes. ENT: Mild sore throat. No stiff neck no neck pain Cardiovascular: Denies chest pain. Respiratory: Of cough Gastrointestinal:   no vomiting.   No diarrhea.  No constipation. Genitourinary: Negative for dysuria. Musculoskeletal: Negative lower extremity swelling Skin: Negative for rash. Neurological: Negative for severe headaches, focal weakness or numbness. 10-point ROS otherwise negative.  ____________________________________________   PHYSICAL EXAM:  VITAL SIGNS: ED Triage Vitals  Enc Vitals Group     BP 11/21/16 0644 (!) 210/116     Pulse Rate 11/21/16 0644 (!) 114     Resp 11/21/16 0644 (!) 24     Temp 11/21/16 0644 98.3 F (36.8 C)     Temp Source 11/21/16 0644 Oral     SpO2 11/21/16 0644 95 %     Weight 11/21/16 0640 175 lb (79.4 kg)     Height 11/21/16 0640 5\' 3"  (1.6 m)     Head Circumference --      Peak Flow --      Pain Score --      Pain Loc --      Pain Edu? --      Excl. in GC? --     Constitutional: Alert and oriented. Well appearing and in no acute distress. Eyes: Conjunctivae are normal. PERRL. EOMI. Head: Atraumatic. Nose: Mild congestion/rhinnorhea. Mouth/Throat: Mucous membranes are moist.  Oropharynx non-erythematous. Neck: No stridor.   Nontender with no meningismus Cardiovascular: Normal rate, regular rhythm. Grossly normal heart sounds.  Good peripheral circulation. Respiratory: Normal respiratory effort.  Diffuse wheeze, no rales or rhonchi, diminished in the bases. Abdominal: Soft and nontender. No distention. No guarding no rebound Back:  There is no focal tenderness or step off.  there is no midline tenderness there are no lesions noted. there is no CVA tenderness Musculoskeletal: No lower extremity tenderness, no upper extremity tenderness. No joint effusions, no DVT signs strong distal pulses no edema Neurologic:  Normal speech and language. No gross focal neurologic deficits are appreciated.  Skin:  Skin is warm, dry and intact. No rash noted. Psychiatric: Mood and affect are normal. Speech and behavior are normal.  ____________________________________________   LABS (all labs  ordered are listed, but only abnormal results are displayed)  Labs Reviewed  INFLUENZA PANEL BY PCR (TYPE A & B)   ____________________________________________  EKG  I personally interpreted any EKGs ordered by me or triage Sinus tachycardia rate 106, total PVCs noted, no acute ST elevation, normal axis. ____________________________________________  RADIOLOGY  I reviewed any imaging ordered by me or triage that were performed during my shift and, if possible, patient and/or family made aware of any abnormal findings. ____________________________________________   PROCEDURES  Procedure(s) performed: None  Procedures  Critical Care performed: None  ____________________________________________   INITIAL IMPRESSION / ASSESSMENT AND PLAN / ED COURSE  Pertinent labs & imaging results that were available during my care of the patient were reviewed by me and considered in my medical decision making (see chart for details).  After treatment, patient is moving air better she feels better. There is still some mild wheeze and she is still diminished in the bases. She is not as tight. We will check a flu test on her and I will observe her until steroids kick in as none thing she is quite ready for discharge at this time. She does however have sats in the mid 90s and looks better. I don't think this represents ACS PE dissection or cardiogenic wheeze.   ----------------------------------------- 10:45 AM on 11/21/2016 -----------------------------------------  Blood work reassuring however when we entered the patient she becomes very tachypnea And short of breath and has to sit down. We will give her another breathing treatment. She is continuing to wheeze, she still tight in the bases again. She may require admission if she does not clear after this breathing treatment.  ----------------------------------------- 12:09 PM on 11/21/2016 -----------------------------------------  Patient  persists with cough and wheeze Workup is otherwise reassuring. However given that she is not tolerating ambulation just secondary wheeze despite multiple different nebulizers and stress we will admit her for further observation. I will give her empiric antibiotics for her chills and cough.  ----------------------------------------- 12:17 PM on 11/21/2016 -----------------------------------------  Further discussion with patient, she does recall that she had a leg clot when she was pregnant 37 years ago. This is something that she had forgotten to mention initially. Even though she is  primarily wheeze, we'll obtain CT to rule out other causes for her shortness of breath.   ____________________________________________   FINAL CLINICAL IMPRESSION(S) / ED DIAGNOSES  Final diagnoses:  None      This chart was dictated using voice recognition software.  Despite best efforts to proofread,  errors can occur which can change meaning.      Jeanmarie PlantJames A Verdine Grenfell, MD 11/21/16 [id number]    Jeanmarie PlantJames A Trezure Cronk, MD 11/21/16 1046    Jeanmarie PlantJames A Raja Liska, MD 11/21/16 1210    Jeanmarie PlantJames A Azan Maneri, MD 11/21/16 1213    Jeanmarie PlantJames A Tommye Lehenbauer, MD 11/21/16 [phone]

## 2016-11-21 NOTE — ED Notes (Signed)
Admitting MD at bedside.

## 2016-11-21 NOTE — ED Provider Notes (Signed)
EKG was performed due to new onset of mild chest pain. EKG interpreted by me Sinus tachycardia rate 103, normal axis intervals QRS ST segments and T waves. No acute ischemic changes.   Sharman CheekPhillip Errin Chewning, MD 11/21/16 [phone]

## 2016-11-21 NOTE — H&P (Signed)
[facility] [place] at [facility]   PATIENT NAME: Sheila Parsons    MR#:  [id number]  DATE OF BIRTH:  July 27, 1961  DATE OF ADMISSION:  11/21/2016  PRIMARY CARE PHYSICIAN: Default, Provider, MD   REQUESTING/REFERRING PHYSICIAN:  No pcp  CHIEF COMPLAINT: Shortness of breath    Chief Complaint  Patient presents with  . Cough    HISTORY OF PRESENT ILLNESS:  Sheila Parsons  is a 56 y.o. female withA past medical history brought in by family because of cough, shortness of breath started yesterday. No chest pain but patient has dry cough. No extremity edema. Patient heart rate went up to 130s when she got up so ER physician could not discharge the patient. Patient received series of nebulizer treatments for wheezing which helped her but still had tachycardia because of that  She is referred for admission.    History reviewed. No pertinent past medical history.  PAST SURGICAL HISTOIRY:  History reviewed. No pertinent surgical history.  SOCIAL HISTORY:   Social History  Substance Use Topics  . Smoking status: Never Smoker  . Smokeless tobacco: Never Used  . Alcohol use No    FAMILY HISTORY:   Family History  Problem Relation Age of Onset  . Healthy Mother   . COPD Father     DRUG ALLERGIES:  No Known Allergies  REVIEW OF SYSTEMS:  CONSTITUTIONAL: No fever, fatigue or weakness.  EYES: No blurred or double vision.  EARS, NOSE, AND THROAT: No tinnitus or ear pain.  RESPIRATORY: , cough, shortness of breath Cardiovascular no chest pain.   STROINTESTINAL: No nausea, vomiting, diarrhea or abdominal pain.  GENITOURINARY: No dysuria, hematuria.  ENDOCRINE: No polyuria, nocturia,  HEMATOLOGY: No anemia, easy bruising or bleeding SKIN: No rash or lesion. MUSCULOSKELETAL: No joint pain or arthritis.   NEUROLOGIC: No tingling, numbness, weakness.  PSYCHIATRY: No anxiety or depression.   MEDICATIONS AT HOME:   Prior to Admission medications    Medication Sig Start Date End Date Taking? Authorizing Provider  fluticasone (FLONASE) 50 MCG/ACT nasal spray Place 2 sprays into both nostrils daily. 04/26/15 04/25/16  Myrna Blazer, MD  hydrochlorothiazide (HYDRODIURIL) 12.5 MG tablet Take 1 tablet (12.5 mg total) by mouth daily. Patient not taking: Reported on 11/21/2016 04/26/15   Myrna Blazer, MD  naproxen (NAPROSYN) 500 MG tablet Take 1 tablet (500 mg total) by mouth 2 (two) times daily with a meal. Patient not taking: Reported on 11/21/2016 02/02/16   Lutricia Feil, PA-C  omeprazole (PRILOSEC) 20 MG capsule Take 1 capsule (20 mg total) by mouth daily. Patient not taking: Reported on 11/21/2016 02/02/16   Lutricia Feil, PA-C      VITAL SIGNS:  Blood pressure (!) 150/92, pulse (!) 111, temperature 98.3 F (36.8 C), temperature source Oral, resp. rate 20, height 5\' 3"  (1.6 m), weight 79.4 kg (175 lb), SpO2 93 %.  PHYSICAL EXAMINATION:  GENERAL:  55 y.o.-year-old patient lying in the bed with no acute distress.  EYES: Pupils equal, round, reactive to light and accommodation. No scleral icterus. Extraocular muscles intact.  HEENT: Head atraumatic, normocephalic. Oropharynx and nasopharynx clear.  NECK:  Supple, no jugular venous distention. No thyroid enlargement, no tenderness.  LUNGS: faint  expiratory wheeze bilaterally.,rhonchi or crepitation. No use of accessory muscles of respiration.  CARDIOVASCULAR: S1, S2 normal. No murmurs, rubs, or gallops.  ABDOMEN: Soft, nontender, nondistended. Bowel sounds present. No organomegaly or mass.  EXTREMITIES: No pedal edema, cyanosis, or clubbing.  NEUROLOGIC: Cranial nerves II through XII are intact. Muscle strength 5/5 in all extremities. Sensation intact. Gait not checked.  PSYCHIATRIC: The patient is alert and oriented x 3.  SKIN: No obvious rash, lesion, or ulcer.   LABORATORY PANEL:   CBC  Recent Labs Lab 11/21/16 0944  WBC 12.2*  HGB 14.3  HCT 42.3  PLT 351    ------------------------------------------------------------------------------------------------------------------  Chemistries   Recent Labs Lab 11/21/16 0944  NA 137  K 3.7  CL 103  CO2 26  GLUCOSE 125*  BUN 9  CREATININE 0.72  CALCIUM 9.4  MG 2.0   ------------------------------------------------------------------------------------------------------------------  Cardiac Enzymes  Recent Labs Lab 11/21/16 0944  TROPONINI <0.03   ------------------------------------------------------------------------------------------------------------------  RADIOLOGY:  Dg Chest 2 View  Result Date: 11/21/2016 CLINICAL DATA:  Shortness of breath for 2 days EXAM: CHEST  2 VIEW COMPARISON:  11/18/2014 FINDINGS: The heart size and mediastinal contours are within normal limits. Both lungs are clear. The visualized skeletal structures are unremarkable. IMPRESSION: No active cardiopulmonary disease. Electronically Signed   By: Alcide CleverMark  Lukens M.D.   On: 11/21/2016 08:03   Ct Angio Chest Pe W And/or Wo Contrast  Result Date: 11/21/2016 CLINICAL DATA:  Nonproductive cough and shortness of breath for several days EXAM: CT ANGIOGRAPHY CHEST WITH CONTRAST TECHNIQUE: Multidetector CT imaging of the chest was performed using the standard protocol during bolus administration of intravenous contrast. Multiplanar CT image reconstructions and MIPs were obtained to evaluate the vascular anatomy. CONTRAST:  75 mL Isovue 370 COMPARISON:  None. FINDINGS: Cardiovascular: The thoracic aorta and its branches are within normal limits. No aneurysmal dilatation or dissection is identified. The pulmonary artery shows a normal branching pattern without focal filling defect to suggest pulmonary embolism. Mild coronary calcifications are seen. The cardiac structures within normal limits. Mediastinum/Nodes: The thoracic inlet is within normal limits. No hilar or mediastinal adenopathy is seen. Lungs/Pleura: Lungs are clear. No  pleural effusion or pneumothorax. Upper Abdomen: Within normal limits. Musculoskeletal: Degenerative changes of the thoracic spine are noted. No acute bony abnormality is seen. Review of the MIP images confirms the above findings. IMPRESSION: No evidence of pulmonary emboli.  No acute abnormality noted. Electronically Signed   By: Alcide CleverMark  Lukens M.D.   On: 11/21/2016 12:54    EKG:   Orders placed or performed during the hospital encounter of 11/21/16  . EKG 12-Lead  . EKG 12-Lead   Sinus tachycardia with some PVCs 10 6 bpm no acute ST elevations. Normal axis. IMPRESSION AND PLAN:   #1 acute bronchitis with tachycardia, wheezing: Admitted to observation status for overnight, continue albuterol with Atrovent nebulizers, empiric antibiotics, IV hydration. #2 hypertension essential hypertension with a history of BP: Add metoprolol. Also check EKG.    All the records are reviewed and case discussed with ED provider. Management plans discussed with the patient, family and they are in agreement.  CODE STATUS: full  TOTAL TIME TAKING CARE OF THIS PATIENT: 55 minutes.    Katha HammingKONIDENA,Senay Sistrunk M.D on 11/21/2016 at 2:17 PM  Between 7am to 6pm - Pager - [phone]  After 6pm go to [email or web address] - password [email or web address]  [facility]  Office  [phone]  CC: Primary care physician; Default, Provider, MD  Note: This dictation was prepared with Dragon dictation along with smaller phrase technology. Any transcriptional errors that result from this process are unintentional.

## 2016-11-21 NOTE — ED Notes (Addendum)
Pt c/o of new onset of chest pain, pain of 2-3/10; pt put on cardiac monitoring per RN judgement.

## 2016-11-21 NOTE — ED Notes (Signed)
Patient transported to X-ray 

## 2016-11-21 NOTE — ED Notes (Signed)
Vitals completed while patient was ambulating.  Patient had increased work of breathing and increased expiratory wheezes present. Patient uncomfortable while walking and assisted back to the room.  MD notified.

## 2016-11-22 LAB — BASIC METABOLIC PANEL
Anion gap: 10 (ref 5–15)
BUN: 13 mg/dL (ref 6–20)
CALCIUM: 9.7 mg/dL (ref 8.9–10.3)
CO2: 23 mmol/L (ref 22–32)
Chloride: 105 mmol/L (ref 101–111)
Creatinine, Ser: 0.59 mg/dL (ref 0.44–1.00)
GFR calc Af Amer: >60 mL/min (ref >60)
GLUCOSE: 156 mg/dL — AB (ref 65–99)
POTASSIUM: 3.7 mmol/L (ref 3.5–5.1)
Sodium: 138 mmol/L (ref 135–145)

## 2016-11-22 LAB — CBC
HEMATOCRIT: 40.1 % (ref 35.0–47.0)
Hemoglobin: 13.7 g/dL (ref 12.0–16.0)
MCH: 30.8 pg (ref 26.0–34.0)
MCHC: 34.3 g/dL (ref 32.0–36.0)
MCV: 90 fL (ref 80.0–100.0)
Platelets: 350 10*3/uL (ref 150–440)
RBC: 4.46 MIL/uL (ref 3.80–5.20)
RDW: 12.9 % (ref 11.5–14.5)
WBC: 10.8 10*3/uL (ref 3.6–11.0)

## 2016-11-22 LAB — GLUCOSE, CAPILLARY: Glucose-Capillary: 152 mg/dL — ABNORMAL HIGH (ref 65–99)

## 2016-11-22 LAB — ECHOCARDIOGRAM COMPLETE
HEIGHTINCHES: 63 in
Weight: 2800 oz

## 2016-11-22 MED ORDER — ALBUTEROL SULFATE HFA 108 (90 BASE) MCG/ACT IN AERS: 2.0000 | INHALATION_SPRAY | Freq: Four times a day (QID) | RESPIRATORY_TRACT | 2 refills | Status: DC | PRN

## 2016-11-22 MED ORDER — GUAIFENESIN-DM 100-10 MG/5ML PO SYRP: 5.0000 mL | ORAL_SOLUTION | ORAL | 0 refills | Status: DC | PRN

## 2016-11-22 MED ORDER — METOPROLOL TARTRATE 25 MG PO TABS: 25.0000 mg | ORAL_TABLET | Freq: Two times a day (BID) | ORAL | 0 refills | Status: DC

## 2016-11-22 MED ORDER — AMLODIPINE BESYLATE 10 MG PO TABS: 10.0000 mg | ORAL_TABLET | Freq: Every day | ORAL | 2 refills | Status: AC

## 2016-11-22 NOTE — Progress Notes (Signed)
[facility] - [place] at [facility]        Cherrill Benjiman CoreHinton was admitted to the Hospital on 11/21/2016 and Discharged  11/22/2016 and should be excused from work/school   for 7  days starting 11/21/2016 , may return to work/school without any restrictions.  [name], Raechelle Sarti M.D on 11/22/2016,at 3:00 PM  [facility] - [place] at [facility]    Office  [phone]

## 2016-11-22 NOTE — Discharge Summary (Signed)
[facility] - [place] at [facility]   PATIENT NAME: Sheila Parsons    MR#:  [id number]  DATE OF BIRTH:  07/01/1961  DATE OF ADMISSION:  11/21/2016   ADMITTING PHYSICIAN: Katha HammingSnehalatha Konidena, MD  DATE OF DISCHARGE: 11/22/2016 PRIMARY CARE PHYSICIAN: Default, Provider, MD   ADMISSION DIAGNOSIS:  Wheeze [R06.2] DISCHARGE DIAGNOSIS:  Active Problems:   COPD with acute exacerbation (HCC) acute bronchitis with tachycardia SECONDARY DIAGNOSIS:  History reviewed. No pertinent past medical history. HOSPITAL COURSE:   #1 acute bronchitis with tachycardia, wheezing: The patient was treated with albuterol, IV Solu-Medrol, IV Levaquin and IV fluid rehydration. Her symptoms is better. Continue albuterol when necessary and Robitussin when necessary.  #2 hypertension essential hypertension with a history of BP:  continue metoprolol, add norvasc.  DISCHARGE CONDITIONS:  Stable, Discharge to home today. CONSULTS OBTAINED:   DRUG ALLERGIES:  No Known Allergies DISCHARGE MEDICATIONS:   Allergies as of 11/22/2016   No Known Allergies     Medication List    STOP taking these medications   fluticasone 50 MCG/ACT nasal spray Commonly known as:  FLONASE   naproxen 500 MG tablet Commonly known as:  NAPROSYN     TAKE these medications   albuterol 108 (90 Base) MCG/ACT inhaler Commonly known as:  PROVENTIL HFA;VENTOLIN HFA Inhale 2 puffs into the lungs every 6 (six) hours as needed for wheezing or shortness of breath.   amLODipine 10 MG tablet Commonly known as:  NORVASC Take 1 tablet (10 mg total) by mouth daily.   guaiFENesin-dextromethorphan 100-10 MG/5ML syrup Commonly known as:  ROBITUSSIN DM Take 5 mLs by mouth every 4 (four) hours as needed for cough.   hydrochlorothiazide 12.5 MG tablet Commonly known as:  HYDRODIURIL Take 1 tablet (12.5 mg total) by mouth daily.   metoprolol tartrate 25 MG tablet Commonly known as:  LOPRESSOR Take 1 tablet (25 mg  total) by mouth 2 (two) times daily. Start taking on:  11/23/2016   omeprazole 20 MG capsule Commonly known as:  PRILOSEC Take 1 capsule (20 mg total) by mouth daily.        DISCHARGE INSTRUCTIONS:  See AVS.  If you experience worsening of your admission symptoms, develop shortness of breath, life threatening emergency, suicidal or homicidal thoughts you must seek medical attention immediately by calling 911 or calling your MD immediately  if symptoms less severe.  You Must read complete instructions/literature along with all the possible adverse reactions/side effects for all the Medicines you take and that have been prescribed to you. Take any new Medicines after you have completely understood and accpet all the possible adverse reactions/side effects.   Please note  You were cared for by a hospitalist during your hospital stay. If you have any questions about your discharge medications or the care you received while you were in the hospital after you are discharged, you can call the unit and asked to speak with the hospitalist on call if the hospitalist that took care of you is not available. Once you are discharged, your primary care physician will handle any further medical issues. Please note that NO REFILLS for any discharge medications will be authorized once you are discharged, as it is imperative that you return to your primary care physician (or establish a relationship with a primary care physician if you do not have one) for your aftercare needs so that they can reassess your need for medications and monitor your lab values.    On the day of  Discharge:  VITAL SIGNS:  Blood pressure (!) 149/72, pulse 98, temperature 98.2 F (36.8 C), temperature source Oral, resp. rate 18, height 5\' 3"  (1.6 m), weight 176 lb 1.6 oz (79.9 kg), SpO2 98 %. PHYSICAL EXAMINATION:  GENERAL:  56 y.o.-year-old patient lying in the bed with no acute distress.  EYES: Pupils equal, round, reactive to  light and accommodation. No scleral icterus. Extraocular muscles intact.  HEENT: Head atraumatic, normocephalic. Oropharynx and nasopharynx clear.  NECK:  Supple, no jugular venous distention. No thyroid enlargement, no tenderness.  LUNGS: Normal breath sounds bilaterally, mild wheezing, no rales,rhonchi or crepitation. No use of accessory muscles of respiration.  CARDIOVASCULAR: S1, S2 normal. No murmurs, rubs, or gallops.  ABDOMEN: Soft, non-tender, non-distended. Bowel sounds present. No organomegaly or mass.  EXTREMITIES: No pedal edema, cyanosis, or clubbing.  NEUROLOGIC: Cranial nerves II through XII are intact. Muscle strength 5/5 in all extremities. Sensation intact. Gait not checked.  PSYCHIATRIC: The patient is alert and oriented x 3.  SKIN: No obvious rash, lesion, or ulcer.  DATA REVIEW:   CBC  Recent Labs Lab 11/22/16 0530  WBC 10.8  HGB 13.7  HCT 40.1  PLT 350    Chemistries   Recent Labs Lab 11/21/16 0944 11/22/16 0530  NA 137 138  K 3.7 3.7  CL 103 105  CO2 26 23  GLUCOSE 125* 156*  BUN 9 13  CREATININE 0.72 0.59  CALCIUM 9.4 9.7  MG 2.0  --      Microbiology Results  No results found for this or any previous visit.  RADIOLOGY:  No results found.   Management plans discussed with the patient, Her daughter and they are in agreement.  CODE STATUS:     Code Status Orders        Start     Ordered   11/21/16 1243  Full code  Continuous     11/21/16 1244    Code Status History    Date Active Date Inactive Code Status Order ID Comments User Context   This patient has a current code status but no historical code status.      TOTAL TIME TAKING CARE OF THIS PATIENT: 33 minutes.    Shaune Pollack M.D on 11/22/2016 at 2:46 PM  Between 7am to 6pm - Pager - [phone]  After 6pm go to [email or web address] - Social research officer, government  [facility]  Office  [phone]  CC: Primary care physician; Default, Provider, MD   Note:  This dictation was prepared with Dragon dictation along with smaller phrase technology. Any transcriptional errors that result from this process are unintentional.

## 2016-11-22 NOTE — Discharge Instructions (Signed)
Heart healthy diet. Exercise and diet control. Follow up PCP.

## 2016-11-22 NOTE — Plan of Care (Signed)
Problem: Health Behavior/Discharge Planning: Goal: Ability to manage health-related needs will improve Outcome: Adequate for Discharge Education and instructions regarding COPD.  Instruction and handouts for discharge medications.

## 2016-11-22 NOTE — Care Management (Signed)
Employed full time but no insurance.  Provided information/ applications on [facility]. Primary nurse will discuss discharge meds with CM in the event patient needs assitance

## 2016-11-22 NOTE — Progress Notes (Signed)
Patient is being discharged to home.  Her daughter is taking her home.  Reviewed new medications with her and her daughter, indication for use and potential side effects, when to call physician.

## 2016-11-22 NOTE — Progress Notes (Signed)
[facility] - [place] at [facility]        Sheila Parsons is with his mother who admitted to the Hospital on 11/21/2016 and Discharged  11/22/2016 and should be excused from work/school   [name], [name] M.D on 11/22/2016,at 3:01 PM  [facility] - [place] at [facility]    Office  [phone]

## 2016-11-22 NOTE — Progress Notes (Signed)
MD notified of Pt with scattered red like rash on pt's chin neck and back. Orders received. Will continue to monitor and assess.

## 2016-12-18 ENCOUNTER — Encounter: Payer: Self-pay | Admitting: *Deleted

## 2016-12-31 ENCOUNTER — Ambulatory Visit: Payer: Self-pay | Admitting: General Surgery

## 2017-01-18 ENCOUNTER — Emergency Department
Admission: EM | Admit: 2017-01-18 | Discharge: 2017-01-18 | Disposition: A | Discharge status: Home / Self Care | Payer: Self-pay | Attending: Emergency Medicine | Admitting: Emergency Medicine

## 2017-01-18 ENCOUNTER — Encounter: Payer: Self-pay | Admitting: Emergency Medicine

## 2017-01-18 ENCOUNTER — Emergency Department: Payer: Self-pay

## 2017-01-18 DIAGNOSIS — Z79899 Other long term (current) drug therapy: Secondary | ICD-10-CM | POA: Insufficient documentation

## 2017-01-18 DIAGNOSIS — J449 Chronic obstructive pulmonary disease, unspecified: Secondary | ICD-10-CM | POA: Insufficient documentation

## 2017-01-18 DIAGNOSIS — J069 Acute upper respiratory infection, unspecified: Secondary | ICD-10-CM | POA: Insufficient documentation

## 2017-01-18 DIAGNOSIS — I10 Essential (primary) hypertension: Secondary | ICD-10-CM | POA: Insufficient documentation

## 2017-01-18 HISTORY — DX: Essential (primary) hypertension: I10

## 2017-01-18 MED ORDER — PREDNISONE 10 MG PO TABS: 50.0000 mg | ORAL_TABLET | Freq: Every day | ORAL | 0 refills | Status: DC

## 2017-01-18 MED ORDER — HYDROCOD POLST-CPM POLST ER 10-8 MG/5ML PO SUER: 5.0000 mL | Freq: Two times a day (BID) | ORAL | 0 refills | Status: DC | PRN

## 2017-01-18 NOTE — ED Notes (Signed)
Pt verbalized understanding of discharge instructions. NAD at this time. 

## 2017-01-18 NOTE — ED Provider Notes (Signed)
[facility] Emergency Department Provider Note  ____________________________________________  Time seen: Approximately 5:45 PM  I have reviewed the triage vital signs and the nursing notes.   HISTORY  Chief Complaint Cough   HPI Sheila Parsons is a 56 y.o. female who presents to the emergency room for evaluation of cough that has been present for the past 4 days. Cough has been nonproductive. She denies fever. Patient is a nonsmoker. She does have a history of COPD. She has not been using her inhaler. She is also complaining of some right upper back pain, but believes it is due to the frequency of her cough.   Past Medical History:  Diagnosis Date  . Hypertension     Patient Active Problem List   Diagnosis Date Noted  . COPD with acute exacerbation (HCC) 11/21/2016    History reviewed. No pertinent surgical history.  Prior to Admission medications   Medication Sig Start Date End Date Taking? Authorizing Provider  albuterol (PROVENTIL HFA;VENTOLIN HFA) 108 (90 Base) MCG/ACT inhaler Inhale 2 puffs into the lungs every 6 (six) hours as needed for wheezing or shortness of breath. 11/22/16   Shaune Pollack, MD  amLODipine (NORVASC) 10 MG tablet Take 1 tablet (10 mg total) by mouth daily. 11/22/16   Shaune Pollack, MD  chlorpheniramine-HYDROcodone [facility] ER) 10-8 MG/5ML SUER Take 5 mLs by mouth every 12 (twelve) hours as needed for cough. 01/18/17   Berkleigh Beckles B Malka Bocek, FNP  guaiFENesin-dextromethorphan (ROBITUSSIN DM) 100-10 MG/5ML syrup Take 5 mLs by mouth every 4 (four) hours as needed for cough. 11/22/16   Shaune Pollack, MD  hydrochlorothiazide (HYDRODIURIL) 12.5 MG tablet Take 1 tablet (12.5 mg total) by mouth daily. Patient not taking: Reported on 11/21/2016 04/26/15   Myrna Blazer, MD  metoprolol tartrate (LOPRESSOR) 25 MG tablet Take 1 tablet (25 mg total) by mouth 2 (two) times daily. 11/23/16   Shaune Pollack, MD  omeprazole (PRILOSEC) 20 MG capsule Take 1  capsule (20 mg total) by mouth daily. Patient not taking: Reported on 11/21/2016 02/02/16   Lutricia Feil, PA-C  predniSONE (DELTASONE) 10 MG tablet Take 5 tablets (50 mg total) by mouth daily. 01/18/17   Chinita Pester, FNP    Allergies Patient has no known allergies.  Family History  Problem Relation Age of Onset  . Healthy Mother   . COPD Father     Social History Social History  Substance Use Topics  . Smoking status: Never Smoker  . Smokeless tobacco: Never Used  . Alcohol use No    Review of Systems Constitutional: Negative for fever/chills ENT: Negative for sore throat. Cardiovascular: Denies chest pain. Respiratory: Negative for shortness of breath. Positive for cough. Gastrointestinal: Negative for nausea,  no vomiting.  Negative for diarrhea.  Musculoskeletal: Negative for body aches Skin: Negative for rash. Neurological: Negative for headaches ____________________________________________   PHYSICAL EXAM:  VITAL SIGNS: ED Triage Vitals [01/18/17 1641]  Enc Vitals Group     BP 137/88     Pulse Rate 97     Resp 18     Temp 98.9 F (37.2 C)     Temp Source Oral     SpO2 100 %     Weight 175 lb (79.4 kg)     Height  (1.626 m)     Head Circumference      Peak Flow      Pain Score 10     Pain Loc      Pain Edu?  Excl. in GC?     Constitutional: Alert and oriented. Acutely ill appearing and in no acute distress. Eyes: Conjunctivae are normal. EOMI. Nose: No congestion noted; no rhinnorhea. Mouth/Throat: Mucous membranes are moist.  Oropharynx normal. Tonsils normal without exudate. Neck: No stridor.  Lymphatic: No cervical lymphadenopathy. Cardiovascular: Normal rate, regular rhythm. Good peripheral circulation. Respiratory: Normal respiratory effort.  No retractions. Breath sounds clear to auscultation throughout. Gastrointestinal: Soft and nontender.  Musculoskeletal: FROM x 4 extremities.  Neurologic:  Normal speech and language.  Skin:   Skin is warm, dry and intact. No rash noted. Psychiatric: Mood and affect are normal. Speech and behavior are normal.  ____________________________________________   LABS (all labs ordered are listed, but only abnormal results are displayed)  Labs Reviewed - No data to display ____________________________________________  EKG  Not indicated ____________________________________________  RADIOLOGY  Chest x-ray negative for focal consolidation, cardiomegaly or effusion per radiology. ____________________________________________   PROCEDURES  Procedure(s) performed: None  Critical Care performed: No ____________________________________________   INITIAL IMPRESSION / ASSESSMENT AND PLAN / ED COURSE  56 year old female presenting to the emergency department for 4 days of cough. She'll be treated with Tussionex and a burst dose of prednisone since she does have a history of COPD. No indication for antibiotics today. She'll be encouraged to follow up with her primary care provider for symptoms that are not improving over the next 2-3 days. She'll be advised to return to the emergency department for symptoms that change or worsen if she is unable schedule an appointment.  Pertinent labs & imaging results that were available during my care of the patient were reviewed by me and considered in my medical decision making (see chart for details).  New Prescriptions   CHLORPHENIRAMINE-HYDROCODONE (TUSSIONEX PENNKINETIC ER) 10-8 MG/5ML SUER    Take 5 mLs by mouth every 12 (twelve) hours as needed for cough.   PREDNISONE (DELTASONE) 10 MG TABLET    Take 5 tablets (50 mg total) by mouth daily.    If controlled substance prescribed during this visit, 12 month history viewed on the [facility] prior to issuing an initial prescription for Schedule II or III opiod. ____________________________________________   FINAL CLINICAL IMPRESSION(S) / ED DIAGNOSES  Final diagnoses:  Viral upper  respiratory tract infection    Note:  This document was prepared using Dragon voice recognition software and may include unintentional dictation errors.     Chinita Pester, FNP 01/18/17 1755    Merrily Brittle, MD 01/18/17 7564

## 2017-01-18 NOTE — Discharge Instructions (Signed)
Use your albuterol inhaler as prescribed. Follow-up with her primary care provider for symptoms that are not improving over the next 2-3 days. Return to the emergency department for symptoms that change or worsen if you are unable to schedule an appointment.

## 2017-01-18 NOTE — ED Triage Notes (Signed)
Cough x 3 days, states at times productive yellow, now having back pain with cough.

## 2017-02-13 ENCOUNTER — Encounter: Payer: Self-pay | Admitting: *Deleted

## 2018-01-09 IMAGING — CT CT ANGIO CHEST
1 of 6 series · 19 of 36 positions shown · IV contrast (APPLIED)
Comparison: None.

CLINICAL DATA: Nonproductive cough and shortness of breath for
several days

EXAM:
CT ANGIOGRAPHY CHEST WITH CONTRAST
TECHNIQUE: Multidetector CT imaging of the chest was performed using the
standard protocol during bolus administration of intravenous
contrast. Multiplanar CT image reconstructions and MIPs were
obtained to evaluate the vascular anatomy.
CONTRAST:  75 mL Isovue 370

[Series 5: thins · axial · 0.64mm/px · z∈[-307,-48]mm · 19 of 289 slices shown]
[im 15/289  lung]
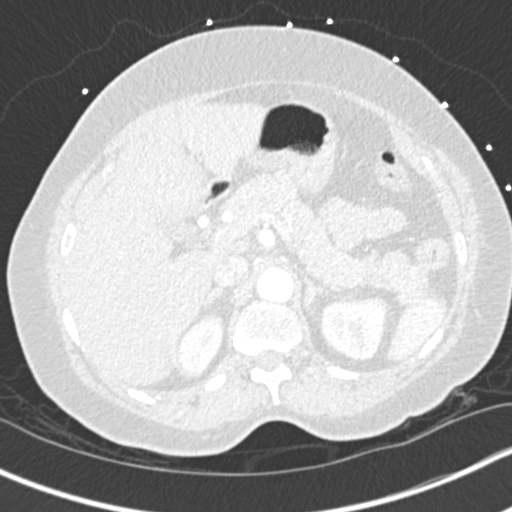
[im 29/289  mediastinal]
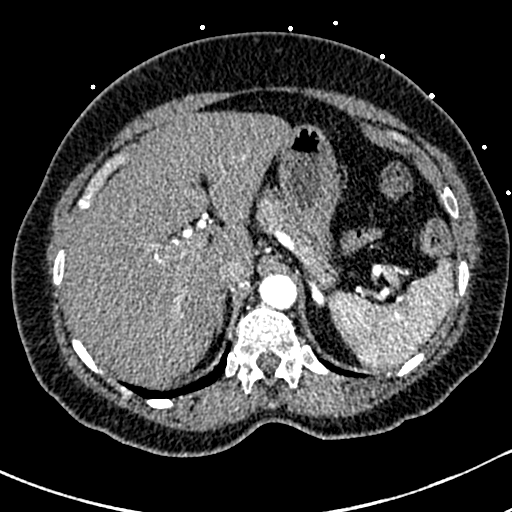
[im 44/289  lung]
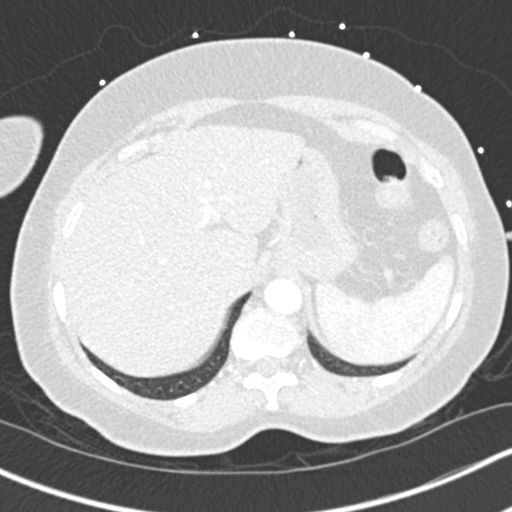
[im 58/289  mediastinal]
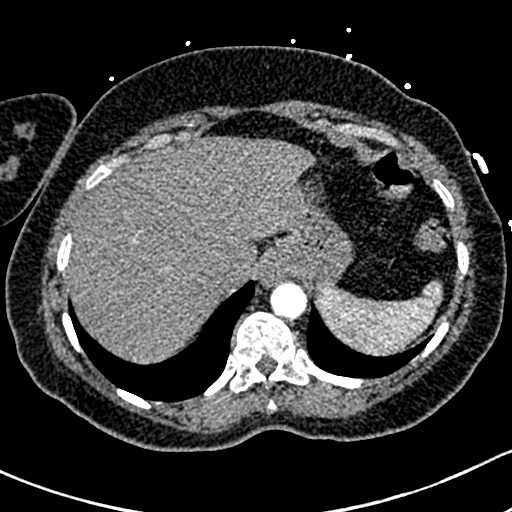
[im 73/289  lung]
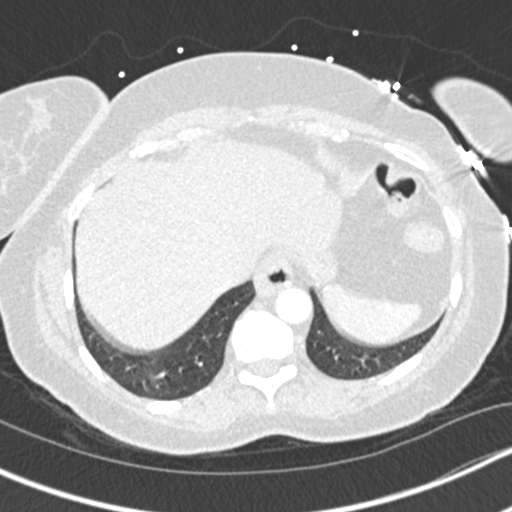
[im 87/289  mediastinal]
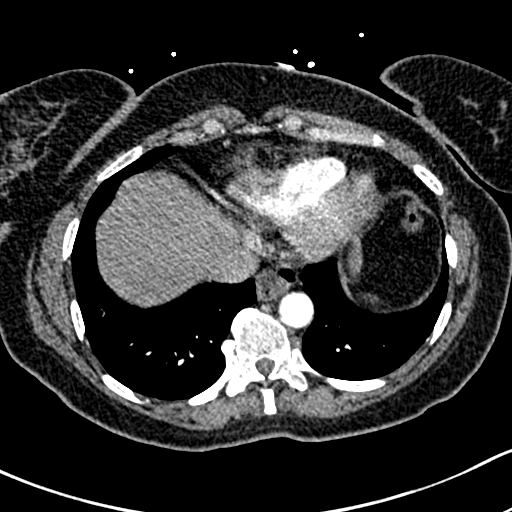
[im 101/289  lung]
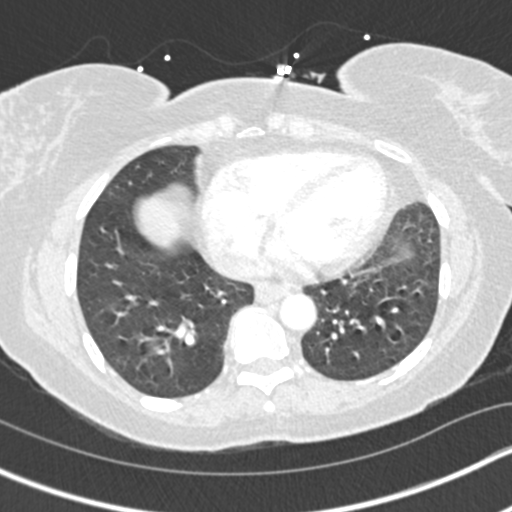
[im 116/289  mediastinal]
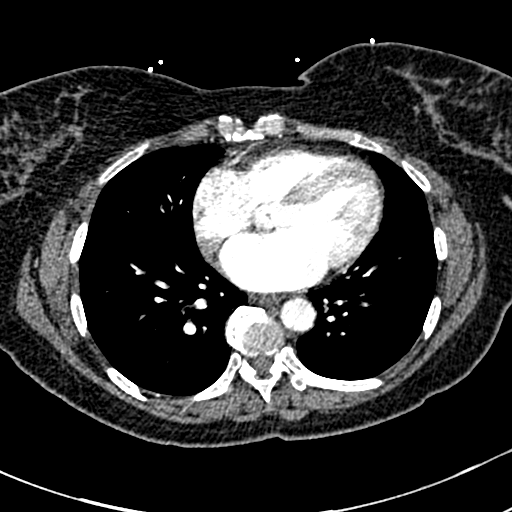
[im 130/289  lung]
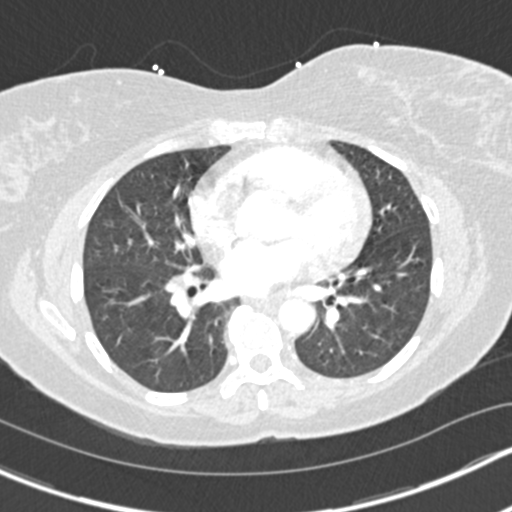
[im 145/289  mediastinal]
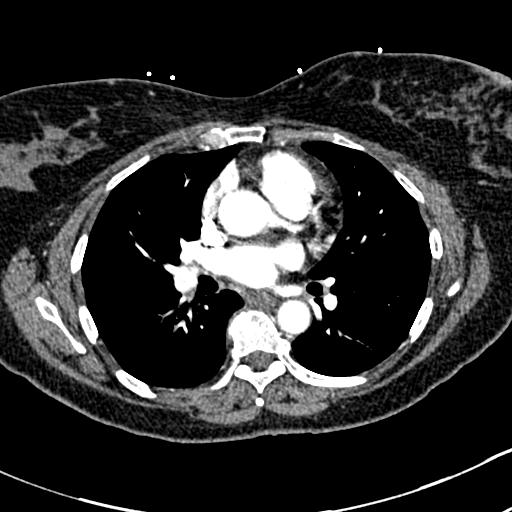
[im 159/289  lung]
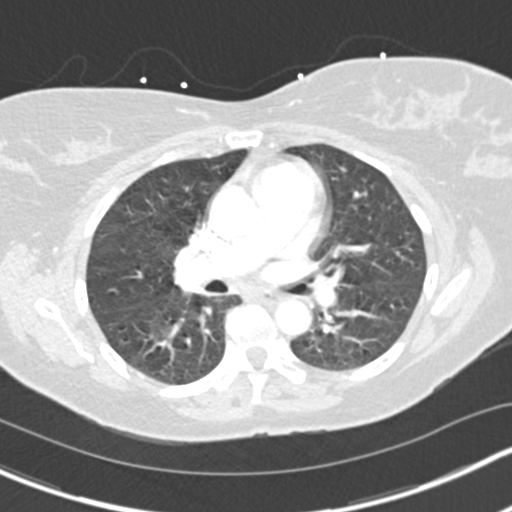
[im 173/289  mediastinal]
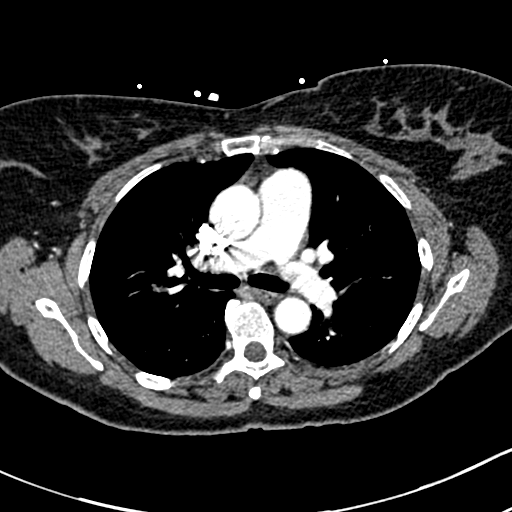
[im 188/289  lung]
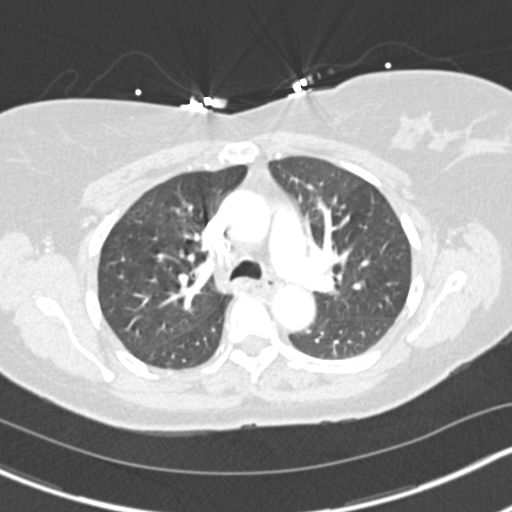
[im 202/289  mediastinal]
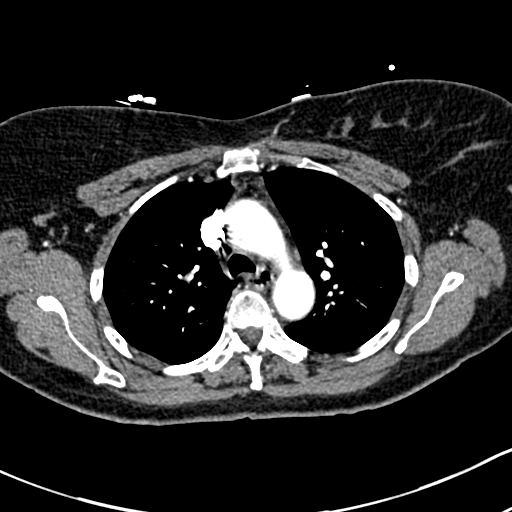
[im 217/289  lung]
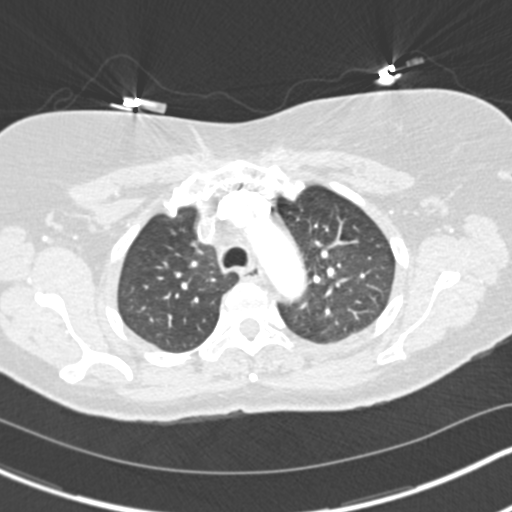
[im 231/289  mediastinal]
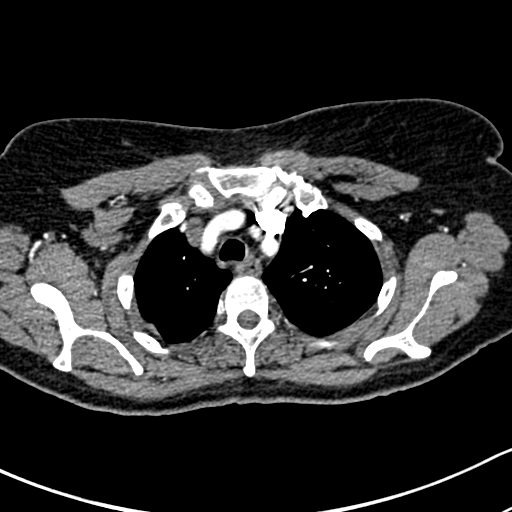
[im 245/289  lung]
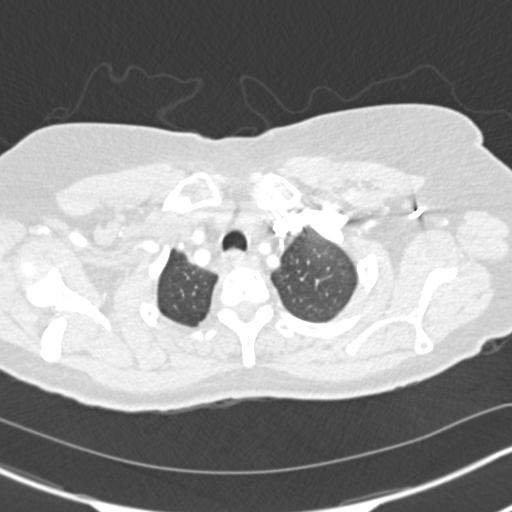
[im 260/289  mediastinal]
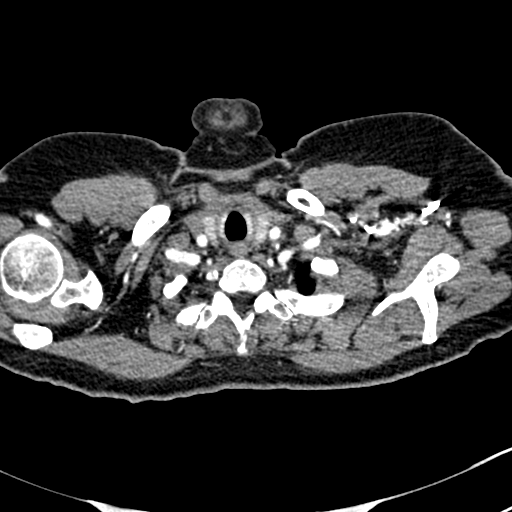
[im 274/289  lung]
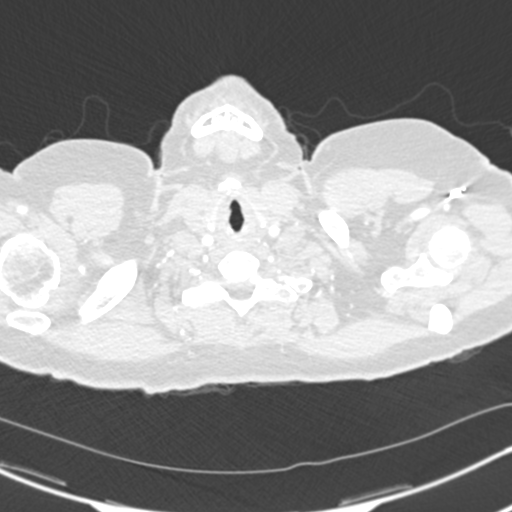

[19 of 36 positions shown; findings below may reference images not displayed]

FINDINGS: Cardiovascular: The thoracic aorta and its branches are within
normal limits. No aneurysmal dilatation or dissection is identified.
The pulmonary artery shows a normal branching pattern without focal
filling defect to suggest pulmonary embolism. Mild coronary
calcifications are seen. The cardiac structures within normal
limits.

Mediastinum/Nodes: The thoracic inlet is within normal limits. No
hilar or mediastinal adenopathy is seen.

Lungs/Pleura: Lungs are clear. No pleural effusion or pneumothorax.

Upper Abdomen: Within normal limits.

Musculoskeletal: Degenerative changes of the thoracic spine are
noted. No acute bony abnormality is seen.

Review of the MIP images confirms the above findings.
IMPRESSION: No evidence of pulmonary emboli.  No acute abnormality noted.

## 2018-01-09 IMAGING — CR DG CHEST 2V
2 series · 2 of 2 positions shown · non-contrast
Comparison: 11/18/2014

CLINICAL DATA: Shortness of breath for 2 days

EXAM:
CHEST  2 VIEW

[chest pa]
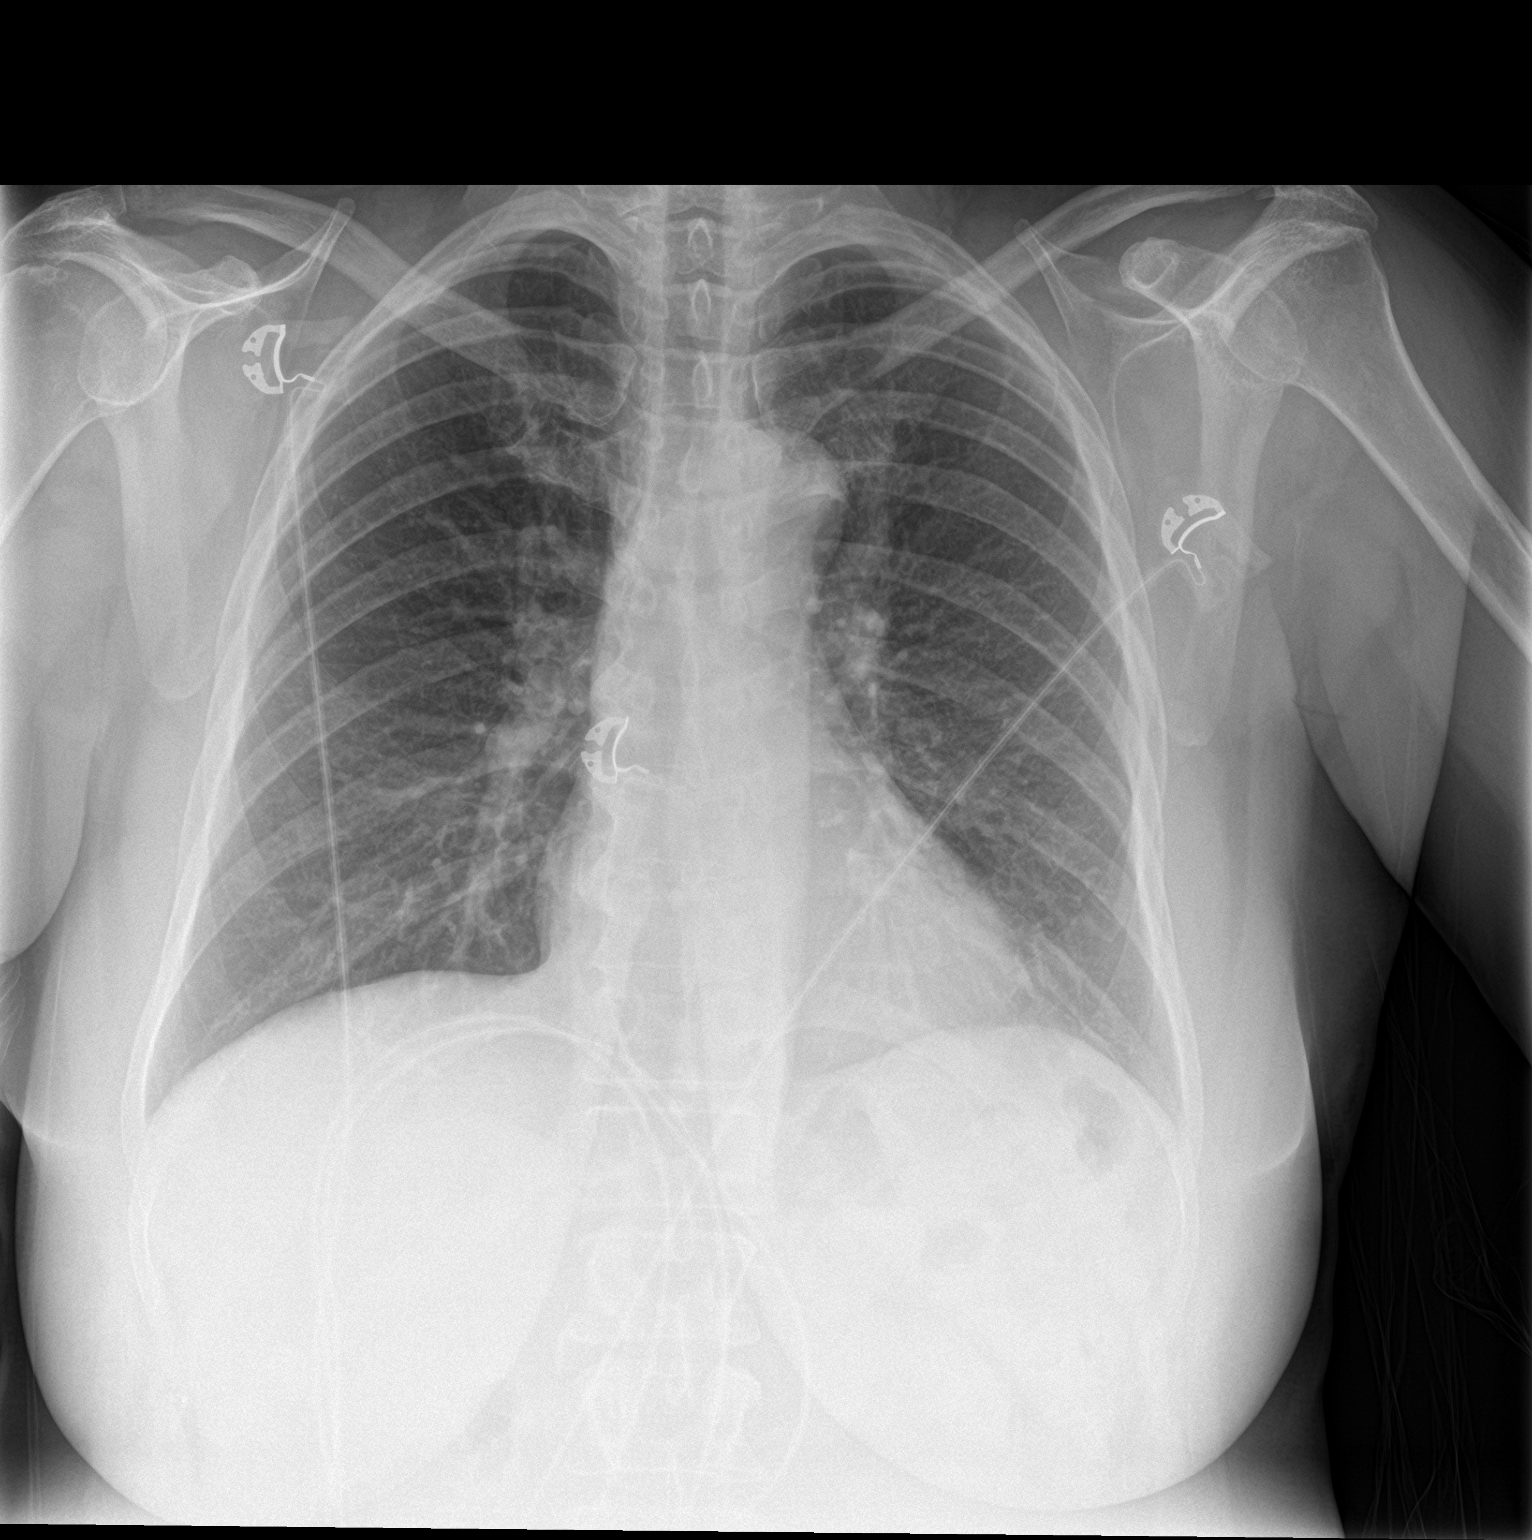

[chest lat]
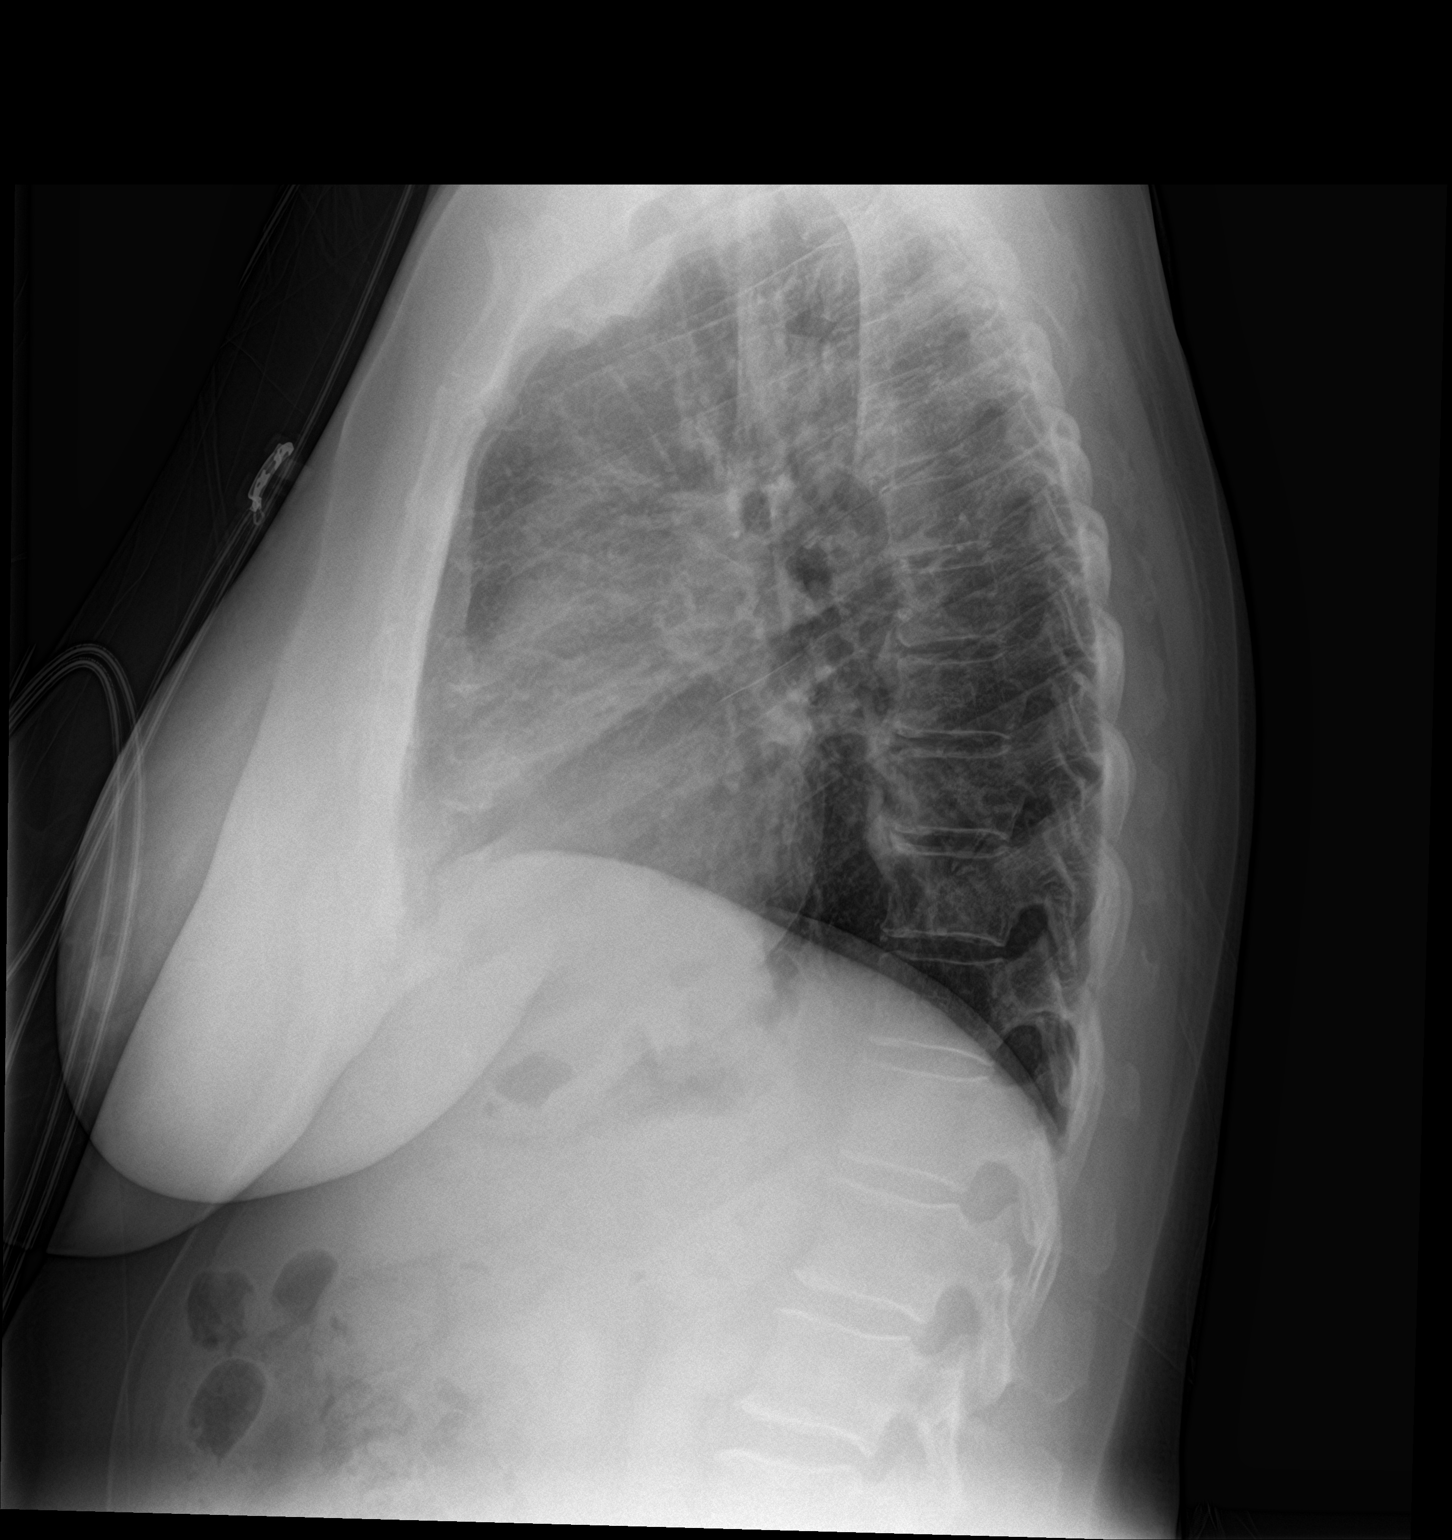

[2 of 2 positions shown; findings below may reference images not displayed]

FINDINGS: The heart size and mediastinal contours are within normal limits.
Both lungs are clear. The visualized skeletal structures are
unremarkable.
IMPRESSION: No active cardiopulmonary disease.

## 2018-11-16 ENCOUNTER — Other Ambulatory Visit: Payer: Self-pay

## 2018-11-16 ENCOUNTER — Emergency Department
Admission: EM | Admit: 2018-11-16 | Discharge: 2018-11-16 | Disposition: A | Discharge status: Home / Self Care | Payer: Self-pay | Attending: Emergency Medicine | Admitting: Emergency Medicine

## 2018-11-16 ENCOUNTER — Emergency Department: Payer: Self-pay

## 2018-11-16 DIAGNOSIS — W010XXA Fall on same level from slipping, tripping and stumbling without subsequent striking against object, initial encounter: Secondary | ICD-10-CM | POA: Insufficient documentation

## 2018-11-16 DIAGNOSIS — I10 Essential (primary) hypertension: Secondary | ICD-10-CM | POA: Insufficient documentation

## 2018-11-16 DIAGNOSIS — S9032XA Contusion of left foot, initial encounter: Secondary | ICD-10-CM | POA: Insufficient documentation

## 2018-11-16 DIAGNOSIS — Y9301 Activity, walking, marching and hiking: Secondary | ICD-10-CM | POA: Insufficient documentation

## 2018-11-16 DIAGNOSIS — Z79899 Other long term (current) drug therapy: Secondary | ICD-10-CM | POA: Insufficient documentation

## 2018-11-16 DIAGNOSIS — Y998 Other external cause status: Secondary | ICD-10-CM | POA: Insufficient documentation

## 2018-11-16 DIAGNOSIS — Y92018 Other place in single-family (private) house as the place of occurrence of the external cause: Secondary | ICD-10-CM | POA: Insufficient documentation

## 2018-11-16 MED ORDER — MELOXICAM 15 MG PO TABS: 15.0000 mg | ORAL_TABLET | Freq: Every day | ORAL | 2 refills | Status: AC

## 2018-11-16 MED ORDER — TRAMADOL HCL 50 MG PO TABS: 50.0000 mg | ORAL_TABLET | Freq: Four times a day (QID) | ORAL | 0 refills | Status: DC | PRN

## 2018-11-16 NOTE — ED Provider Notes (Signed)
[facility] Emergency Department Provider Note  ____________________________________________   First MD Initiated Contact with Patient 11/16/18 1552     (approximate)  I have reviewed the triage vital signs and the nursing notes.   HISTORY  Chief Complaint Toe Injury    HPI Sheila Parsons is a 58 y.o. female presents emergency department complaining of left foot pain.  She states she hurt her foot out on her deck.  She when she tripped.  She states the third toe is very bruised and swollen with bruising at the bottom of the foot.  States it is painful to bear weight.  She denies any other injury.    Past Medical History:  Diagnosis Date  . Hypertension     Patient Active Problem List   Diagnosis Date Noted  . COPD with acute exacerbation (HCC) 11/21/2016    History reviewed. No pertinent surgical history.  Prior to Admission medications   Medication Sig Start Date End Date Taking? Authorizing Provider  albuterol (PROVENTIL HFA;VENTOLIN HFA) 108 (90 Base) MCG/ACT inhaler Inhale 2 puffs into the lungs every 6 (six) hours as needed for wheezing or shortness of breath. 11/22/16   Shaune Pollack, MD  amLODipine (NORVASC) 10 MG tablet Take 1 tablet (10 mg total) by mouth daily. 11/22/16   Shaune Pollack, MD  chlorpheniramine-HYDROcodone [facility] ER) 10-8 MG/5ML SUER Take 5 mLs by mouth every 12 (twelve) hours as needed for cough. 01/18/17   Triplett, Cari B, FNP  guaiFENesin-dextromethorphan (ROBITUSSIN DM) 100-10 MG/5ML syrup Take 5 mLs by mouth every 4 (four) hours as needed for cough. 11/22/16   Shaune Pollack, MD  hydrochlorothiazide (HYDRODIURIL) 12.5 MG tablet Take 1 tablet (12.5 mg total) by mouth daily. Patient not taking: Reported on 11/21/2016 04/26/15   Schaevitz, Myra Rude, MD  meloxicam (MOBIC) 15 MG tablet Take 1 tablet (15 mg total) by mouth daily. 11/16/18 11/16/19  Faythe Ghee, PA-C  metoprolol tartrate (LOPRESSOR) 25 MG tablet Take 1 tablet  (25 mg total) by mouth 2 (two) times daily. 11/23/16   Shaune Pollack, MD  omeprazole (PRILOSEC) 20 MG capsule Take 1 capsule (20 mg total) by mouth daily. Patient not taking: Reported on 11/21/2016 02/02/16   Lutricia Feil, PA-C  predniSONE (DELTASONE) 10 MG tablet Take 5 tablets (50 mg total) by mouth daily. 01/18/17   Triplett, Rulon Eisenmenger B, FNP  traMADol (ULTRAM) 50 MG tablet Take 1 tablet (50 mg total) by mouth every 6 (six) hours as needed. 11/16/18   Faythe Ghee, PA-C    Allergies Patient has no known allergies.  Family History  Problem Relation Age of Onset  . Healthy Mother   . COPD Father     Social History Social History   Tobacco Use  . Smoking status: Never Smoker  . Smokeless tobacco: Never Used  Substance Use Topics  . Alcohol use: No  . Drug use: Not on file    Review of Systems  Constitutional: No fever/chills Eyes: No visual changes. ENT: No sore throat. Respiratory: Denies cough Genitourinary: Negative for dysuria. Musculoskeletal: Negative for back pain.  Positive for left foot pain Skin: Negative for rash.    ____________________________________________   PHYSICAL EXAM:  VITAL SIGNS: ED Triage Vitals  Enc Vitals Group     BP 11/16/18 1442 (!) 146/79     Pulse Rate 11/16/18 1442 99     Resp 11/16/18 1442 16     Temp 11/16/18 1442 98.2 F (36.8 C)  Temp Source 11/16/18 1442 Oral     SpO2 11/16/18 1442 99 %     Weight 11/16/18 1442 180 lb (81.6 kg)     Height 11/16/18 1442 5\' 3"  (1.6 m)     Head Circumference --      Peak Flow --      Pain Score 11/16/18 1445 10     Pain Loc --      Pain Edu? --      Excl. in GC? --     Constitutional: Alert and oriented. Well appearing and in no acute distress. Eyes: Conjunctivae are normal.  Head: Atraumatic. Nose: No congestion/rhinnorhea. Mouth/Throat: Mucous membranes are moist.   Neck:  supple no lymphadenopathy noted Cardiovascular: Normal rate, regular rhythm. Respiratory: Normal respiratory  effort.  No retractions,  GU: deferred Musculoskeletal: Left foot is swollen and tender with bruising at the great second and third toe.  Neurovascular is intact.   Neurologic:  Normal speech and language.  Skin:  Skin is warm, dry and intact. No rash noted. Psychiatric: Mood and affect are normal. Speech and behavior are normal.  ____________________________________________   LABS (all labs ordered are listed, but only abnormal results are displayed)  Labs Reviewed - No data to display ____________________________________________   ____________________________________________  RADIOLOGY  xray left foot is negative  ____________________________________________   PROCEDURES  Procedure(s) performed: Ace wrap and postop shoe were applied   Procedures    ____________________________________________   INITIAL IMPRESSION / ASSESSMENT AND PLAN / ED COURSE  Pertinent labs & imaging results that were available during my care of the patient were reviewed by me and considered in my medical decision making (see chart for details).   Patient is a 58 year old female presents emergency department complaining of left foot pain.   Physical exam left foot shows bruising under the great toe second toe and third toe.  There is swelling is noted also.  X-ray of the left foot is negative for fracture  Explained the x-ray findings to the patient.  She is placed in a Ace wrap and postop shoe.  She given a prescription for meloxicam and tramadol.  She is to follow-up podiatry if not better in 5 days.  Apply ice and elevate the area.  She given a work note as she stands at work.  She is discharged in stable condition.  As part of my medical decision making, I reviewed the following data within the electronic MEDICAL RECORD NUMBER Nursing notes reviewed and incorporated, Old chart reviewed, Radiograph reviewed x-ray of the left foot is negative, Notes from prior ED visits and [place] Controlled Substance  Database  ____________________________________________   FINAL CLINICAL IMPRESSION(S) / ED DIAGNOSES  Final diagnoses:  Contusion of left foot, initial encounter      NEW MEDICATIONS STARTED DURING THIS VISIT:  New Prescriptions   MELOXICAM (MOBIC) 15 MG TABLET    Take 1 tablet (15 mg total) by mouth daily.   TRAMADOL (ULTRAM) 50 MG TABLET    Take 1 tablet (50 mg total) by mouth every 6 (six) hours as needed.     Note:  This document was prepared using Dragon voice recognition software and may include unintentional dictation errors.    Faythe GheeFisher, Towanna Avery W, PA-C 11/16/18 2034    Jeanmarie PlantMcShane, James A, MD 11/16/18 [phone]

## 2018-11-16 NOTE — ED Triage Notes (Signed)
Yesterday tripped going up back steps. 3rd toe bruising and swelling. A&O, ambulatory.

## 2018-11-16 NOTE — Discharge Instructions (Addendum)
Follow-up with your regular doctor if not better in 3 days.  Or you may call podiatry.  Elevate and ice the foot.  Take the medications as prescribed.  Return if worsening.

## 2020-02-05 ENCOUNTER — Ambulatory Visit: Payer: Self-pay | Attending: Internal Medicine

## 2020-02-05 DIAGNOSIS — Z23 Encounter for immunization: Secondary | ICD-10-CM

## 2020-02-05 NOTE — Progress Notes (Signed)
   Covid-19 Vaccination Clinic  Name:  Sheila Parsons    MRN: [id number] DOB: [birth year]  02/05/2020  Sheila Parsons was observed post Covid-19 immunization for 15 minutes without incident. She was provided with Vaccine Information Sheet and instruction to access the V-Safe system.   Sheila Parsons was instructed to call 911 with any severe reactions post vaccine: Marland Kitchen Difficulty breathing  . Swelling of face and throat  . A fast heartbeat  . A bad rash all over body  . Dizziness and weakness   Immunizations Administered    Name Date Dose VIS Date Route   Pfizer COVID-19 Vaccine 02/05/2020  1:53 PM 0.3 mL 12/08/2018 Intramuscular   Manufacturer: [employer], [employer]   Lot: [id number]   NDC: [address]-3

## 2020-02-29 ENCOUNTER — Ambulatory Visit: Payer: Self-pay | Attending: Internal Medicine

## 2020-02-29 DIAGNOSIS — Z23 Encounter for immunization: Secondary | ICD-10-CM

## 2020-02-29 NOTE — Progress Notes (Signed)
   Covid-19 Vaccination Clinic  Name:  Sheila Parsons    MRN: [id number] DOB: [birth year]  02/29/2020  [name] was observed post Covid-19 immunization for 15 minutes without incident. She was provided with Vaccine Information Sheet and instruction to access the V-Safe system.   [name] was instructed to call 911 with any severe reactions post vaccine: Marland Kitchen Difficulty breathing  . Swelling of face and throat  . A fast heartbeat  . A bad rash all over body  . Dizziness and weakness   Immunizations Administered    Name Date Dose VIS Date Route   Pfizer COVID-19 Vaccine 02/29/2020 11:56 AM 0.3 mL 12/08/2018 Intramuscular   Manufacturer: [employer], [employer]   Lot: [id number]   NDC: [address]-2

## 2021-03-06 ENCOUNTER — Other Ambulatory Visit: Payer: Self-pay | Admitting: Physician Assistant

## 2021-03-06 DIAGNOSIS — Z1231 Encounter for screening mammogram for malignant neoplasm of breast: Secondary | ICD-10-CM

## 2022-08-06 ENCOUNTER — Other Ambulatory Visit: Payer: Self-pay | Admitting: Physician Assistant

## 2022-08-06 DIAGNOSIS — Z1231 Encounter for screening mammogram for malignant neoplasm of breast: Secondary | ICD-10-CM

## 2023-01-23 ENCOUNTER — Ambulatory Visit
Admission: RE | Admit: 2023-01-23 | Discharge: 2023-01-23 | Disposition: A | Discharge status: Home / Self Care | Payer: 59 | Source: Ambulatory Visit | Attending: Physician Assistant | Admitting: Physician Assistant

## 2023-01-23 DIAGNOSIS — Z1231 Encounter for screening mammogram for malignant neoplasm of breast: Secondary | ICD-10-CM | POA: Insufficient documentation

## 2023-02-05 DIAGNOSIS — I10 Essential (primary) hypertension: Secondary | ICD-10-CM | POA: Diagnosis not present

## 2023-02-05 DIAGNOSIS — Z87898 Personal history of other specified conditions: Secondary | ICD-10-CM | POA: Diagnosis not present

## 2023-02-05 DIAGNOSIS — Z0000 Encounter for general adult medical examination without abnormal findings: Secondary | ICD-10-CM | POA: Diagnosis not present

## 2023-02-05 DIAGNOSIS — J3089 Other allergic rhinitis: Secondary | ICD-10-CM | POA: Diagnosis not present

## 2023-08-12 DIAGNOSIS — I10 Essential (primary) hypertension: Secondary | ICD-10-CM | POA: Diagnosis not present

## 2023-08-12 DIAGNOSIS — Z0000 Encounter for general adult medical examination without abnormal findings: Secondary | ICD-10-CM | POA: Diagnosis not present

## 2023-08-12 DIAGNOSIS — K439 Ventral hernia without obstruction or gangrene: Secondary | ICD-10-CM | POA: Diagnosis not present

## 2023-08-12 DIAGNOSIS — Z1211 Encounter for screening for malignant neoplasm of colon: Secondary | ICD-10-CM | POA: Diagnosis not present

## 2023-08-12 DIAGNOSIS — Z87898 Personal history of other specified conditions: Secondary | ICD-10-CM | POA: Diagnosis not present

## 2023-08-12 DIAGNOSIS — R21 Rash and other nonspecific skin eruption: Secondary | ICD-10-CM | POA: Diagnosis not present

## 2023-08-25 ENCOUNTER — Other Ambulatory Visit: Payer: Self-pay | Admitting: Surgery

## 2023-08-25 DIAGNOSIS — Z1211 Encounter for screening for malignant neoplasm of colon: Secondary | ICD-10-CM | POA: Diagnosis not present

## 2023-08-25 DIAGNOSIS — K436 Other and unspecified ventral hernia with obstruction, without gangrene: Secondary | ICD-10-CM

## 2023-09-04 ENCOUNTER — Ambulatory Visit
Admission: RE | Admit: 2023-09-04 | Discharge: 2023-09-04 | Disposition: A | Discharge status: Home / Self Care | Payer: 59 | Source: Ambulatory Visit | Attending: Surgery | Admitting: Surgery

## 2023-09-04 ENCOUNTER — Encounter: Payer: Self-pay | Admitting: Emergency Medicine

## 2023-09-04 ENCOUNTER — Emergency Department
Admission: EM | Admit: 2023-09-04 | Discharge: 2023-09-04 | Disposition: A | Discharge status: Home / Self Care | Payer: 59 | Attending: Emergency Medicine | Admitting: Emergency Medicine

## 2023-09-04 ENCOUNTER — Other Ambulatory Visit: Payer: Self-pay

## 2023-09-04 ENCOUNTER — Emergency Department: Payer: 59

## 2023-09-04 DIAGNOSIS — X500XXA Overexertion from strenuous movement or load, initial encounter: Secondary | ICD-10-CM | POA: Diagnosis not present

## 2023-09-04 DIAGNOSIS — K436 Other and unspecified ventral hernia with obstruction, without gangrene: Secondary | ICD-10-CM | POA: Insufficient documentation

## 2023-09-04 DIAGNOSIS — I10 Essential (primary) hypertension: Secondary | ICD-10-CM | POA: Insufficient documentation

## 2023-09-04 DIAGNOSIS — K449 Diaphragmatic hernia without obstruction or gangrene: Secondary | ICD-10-CM | POA: Diagnosis not present

## 2023-09-04 DIAGNOSIS — Z1211 Encounter for screening for malignant neoplasm of colon: Secondary | ICD-10-CM | POA: Insufficient documentation

## 2023-09-04 DIAGNOSIS — J449 Chronic obstructive pulmonary disease, unspecified: Secondary | ICD-10-CM | POA: Insufficient documentation

## 2023-09-04 DIAGNOSIS — M25511 Pain in right shoulder: Secondary | ICD-10-CM

## 2023-09-04 DIAGNOSIS — K4020 Bilateral inguinal hernia, without obstruction or gangrene, not specified as recurrent: Secondary | ICD-10-CM | POA: Diagnosis not present

## 2023-09-04 DIAGNOSIS — M19011 Primary osteoarthritis, right shoulder: Secondary | ICD-10-CM | POA: Diagnosis not present

## 2023-09-04 DIAGNOSIS — K439 Ventral hernia without obstruction or gangrene: Secondary | ICD-10-CM | POA: Diagnosis not present

## 2023-09-04 MED ORDER — MELOXICAM 15 MG PO TABS: 15.0000 mg | ORAL_TABLET | Freq: Every day | ORAL | 0 refills | Status: DC

## 2023-09-04 MED ORDER — MELOXICAM 15 MG PO TABS: 15.0000 mg | ORAL_TABLET | Freq: Every day | ORAL | 0 refills | Status: AC

## 2023-09-04 MED ORDER — DEXAMETHASONE SODIUM PHOSPHATE 10 MG/ML IJ SOLN
10.0000 mg | Freq: Once | INTRAMUSCULAR | Status: AC
  Administered 2023-09-04: 10 mg via INTRAMUSCULAR
  Filled 2023-09-04: qty 1

## 2023-09-04 MED ORDER — KETOROLAC TROMETHAMINE 15 MG/ML IJ SOLN
15.0000 mg | Freq: Once | INTRAMUSCULAR | Status: AC
  Administered 2023-09-04: 15 mg via INTRAMUSCULAR
  Filled 2023-09-04: qty 1

## 2023-09-04 NOTE — Discharge Instructions (Addendum)
Your shoulder x-rays showed that you do not have any fractures today.  I have sent a patient called meloxicam, this is an anti-inflammatory, you can start this medication tomorrow. Please take it for 2 weeks. While taking this medication do not take any other NSAIDs like ibuprofen, Motrin, Advil, Aleve or naproxen. I also encourage you to continue to use topical pain relievers like the Biofreeze patches.  You can use ice and heat.  You can take 650 mg of Tylenol every 6 hours as needed for pain.  You will need to follow-up with orthopedics whose information is attached.  Please call the office to schedule an appointment.

## 2023-09-04 NOTE — ED Triage Notes (Signed)
Patient to ED via POV for right shoulder pain that radiates down arm. Ongoing x2 weeks. Pain worse at night. No injury. States taking OTC meds with no relief.

## 2023-09-04 NOTE — ED Notes (Addendum)
See triage note  Presents with right arm pain  States pain is moving from neck into arm  started 2 weeks ago W/O injury

## 2023-09-04 NOTE — ED Provider Notes (Signed)
[facility] Provider Note    Event Date/Time   First MD Initiated Contact with Patient 09/04/23 1111     (approximate)   History   Shoulder Injury   HPI  Sheila Parsons is a 62 y.o. female with PMH of hypertension and COPD presents for evaluation of right shoulder pain.  Patient states this has been going on for about 2 weeks.  She has tried taking Tylenol and ibuprofen at home as well as applying Biofreeze patches.  She states that nothing has helped.  She denies numbness and weakness but states the pain does radiate down her arm to her elbow and into her fingers.  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      Physical Exam   Triage Vital Signs: ED Triage Vitals  Encounter Vitals Group     BP 09/04/23 1016 (!) 155/89     Systolic BP Percentile --      Diastolic BP Percentile --      Pulse Rate 09/04/23 1016 77     Resp 09/04/23 1016 18     Temp 09/04/23 1016 97.9 F (36.6 C)     Temp Source 09/04/23 1016 Oral     SpO2 09/04/23 1016 98 %     Weight 09/04/23 1017 187 lb (84.8 kg)     Height 09/04/23 1017 5\' 3"  (1.6 m)     Head Circumference --      Peak Flow --      Pain Score 09/04/23 1016 10     Pain Loc --      Pain Education --      Exclude from Growth Chart --     Most recent vital signs: Vitals:   09/04/23 1016  BP: (!) 155/89  Pulse: 77  Resp: 18  Temp: 97.9 F (36.6 C)  SpO2: 98%    General: Awake, no distress.  CV:  Good peripheral perfusion.  RRR. Resp:  Normal effort.  CTAB. Abd:  No distention.  R arm:  Tender to palpation starting at the posterior shoulder joint all the way down the arm to the elbow, shoulder ROM maintained but does elicit pain with abduction and Apley scratch test, 4/5 strength of right arm when compared to left, radial pulses 2+ and regular, sensation intact across all dermatomes.  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  PROCEDURES:  Critical Care performed: No  Procedures   MEDICATIONS ORDERED IN ED: Medications  ketorolac (TORADOL) 15 MG/ML injection 15 mg (has no administration in time range)  dexamethasone (DECADRON) injection 10 mg (has no administration in time range)     IMPRESSION / MDM / ASSESSMENT AND PLAN / ED COURSE  I reviewed the triage vital signs and the nursing notes.                             62 year old female presents for evaluation of shoulder pain. Patient was hypertensive in triage, otherwise VSS and patient NAD.  Differential diagnosis includes, but is not limited  to, fracture, dislocation, rotator cuff tear, shoulder impingement, arthritis.   Patient's presentation is most consistent with acute complicated illness / injury requiring diagnostic workup.  Right shoulder xray was negative for any acute abnormalities.   Physical exam does show some pain with ROM as well as some limited ROM.  Patient will be given a dose of Toradol and dexamethasone while in the ED.  I will also start her on some meloxicam outpatient.  Patient will need to follow-up with orthopedics.  She can continue to take Tylenol, use ice and heat as well as other topical pain relievers.  She voiced understanding, all questions were answered and she was stable at discharge.    FINAL CLINICAL IMPRESSION(S) / ED DIAGNOSES   Final diagnoses:  Acute pain of right shoulder     Rx / DC Orders   ED Discharge Orders          Ordered    meloxicam (MOBIC) 15 MG tablet  Daily        09/04/23 1227             Note:  This document was prepared using Dragon voice  recognition software and may include unintentional dictation errors.   Cameron Ali, PA-C 09/04/23 1230    Jene Every, MD 09/04/23 1325

## 2023-09-09 DIAGNOSIS — M50123 Cervical disc disorder at C6-C7 level with radiculopathy: Secondary | ICD-10-CM | POA: Diagnosis not present

## 2023-09-09 DIAGNOSIS — M5412 Radiculopathy, cervical region: Secondary | ICD-10-CM | POA: Diagnosis not present

## 2023-09-09 DIAGNOSIS — M542 Cervicalgia: Secondary | ICD-10-CM | POA: Diagnosis not present

## 2023-09-09 DIAGNOSIS — M5030 Other cervical disc degeneration, unspecified cervical region: Secondary | ICD-10-CM | POA: Diagnosis not present

## 2023-09-23 DIAGNOSIS — M5030 Other cervical disc degeneration, unspecified cervical region: Secondary | ICD-10-CM | POA: Diagnosis not present

## 2023-09-23 DIAGNOSIS — Z6831 Body mass index (BMI) 31.0-31.9, adult: Secondary | ICD-10-CM | POA: Diagnosis not present

## 2023-09-23 DIAGNOSIS — M5412 Radiculopathy, cervical region: Secondary | ICD-10-CM | POA: Diagnosis not present

## 2023-09-23 DIAGNOSIS — M50123 Cervical disc disorder at C6-C7 level with radiculopathy: Secondary | ICD-10-CM | POA: Diagnosis not present

## 2023-10-29 ENCOUNTER — Ambulatory Visit: Payer: 59 | Admitting: Certified Registered Nurse Anesthetist

## 2023-10-29 ENCOUNTER — Encounter
Admission: RE | Disposition: A | Discharge status: Home / Self Care | Payer: Self-pay | Source: Home / Self Care | Attending: Surgery

## 2023-10-29 ENCOUNTER — Ambulatory Visit
Admission: RE | Admit: 2023-10-29 | Discharge: 2023-10-29 | Disposition: A | Discharge status: Home / Self Care | Payer: 59 | Attending: Surgery | Admitting: Surgery

## 2023-10-29 DIAGNOSIS — K640 First degree hemorrhoids: Secondary | ICD-10-CM | POA: Insufficient documentation

## 2023-10-29 DIAGNOSIS — K436 Other and unspecified ventral hernia with obstruction, without gangrene: Secondary | ICD-10-CM | POA: Diagnosis not present

## 2023-10-29 DIAGNOSIS — Z1211 Encounter for screening for malignant neoplasm of colon: Secondary | ICD-10-CM | POA: Diagnosis not present

## 2023-10-29 DIAGNOSIS — I10 Essential (primary) hypertension: Secondary | ICD-10-CM | POA: Diagnosis not present

## 2023-10-29 DIAGNOSIS — J449 Chronic obstructive pulmonary disease, unspecified: Secondary | ICD-10-CM | POA: Insufficient documentation

## 2023-10-29 DIAGNOSIS — K649 Unspecified hemorrhoids: Secondary | ICD-10-CM | POA: Diagnosis not present

## 2023-10-29 HISTORY — PX: COLONOSCOPY WITH PROPOFOL: SHX5780

## 2023-10-29 SURGERY — COLONOSCOPY WITH PROPOFOL
Anesthesia: General

## 2023-10-29 MED ORDER — PROPOFOL 10 MG/ML IV BOLUS
INTRAVENOUS | Status: DC | PRN
  Administered 2023-10-29: 150 ug/kg/min via INTRAVENOUS
  Administered 2023-10-29: 80 mg via INTRAVENOUS

## 2023-10-29 MED ORDER — LIDOCAINE HCL (PF) 2 % IJ SOLN
INTRAMUSCULAR | Status: AC
  Filled 2023-10-29: qty 5

## 2023-10-29 MED ORDER — SODIUM CHLORIDE 0.9 % IV SOLN
INTRAVENOUS | Status: DC
  Administered 2023-10-29: 20 mL/h via INTRAVENOUS

## 2023-10-29 MED ORDER — LIDOCAINE HCL (CARDIAC) PF 100 MG/5ML IV SOSY
PREFILLED_SYRINGE | INTRAVENOUS | Status: DC | PRN
  Administered 2023-10-29: 50 mg via INTRAVENOUS

## 2023-10-29 MED ORDER — PROPOFOL 1000 MG/100ML IV EMUL
INTRAVENOUS | Status: AC
  Filled 2023-10-29: qty 100

## 2023-10-29 NOTE — Interval H&P Note (Signed)
 History and Physical Interval Note:  10/29/2023 10:41 AM  Sheila Parsons  has presented today for surgery, with the diagnosis of encounter for screening colonoscopy Z12.11.  The various methods of treatment have been discussed with the patient and family. After consideration of risks, benefits and other options for treatment, the patient has consented to  Procedure(s): COLONOSCOPY WITH PROPOFOL  (N/A) as a surgical intervention.  The patient's history has been reviewed, patient examined, no change in status, stable for surgery.  I have reviewed the patient's chart and labs.  Questions were answered to the patient's satisfaction.     Yobani Schertzer Rosea Conch

## 2023-10-29 NOTE — Anesthesia Postprocedure Evaluation (Signed)
 Anesthesia Post Note  Patient: Sheila Parsons  Procedure(s) Performed: COLONOSCOPY WITH PROPOFOL   Patient location during evaluation: Endoscopy Anesthesia Type: General Level of consciousness: awake and alert Pain management: pain level controlled Vital Signs Assessment: post-procedure vital signs reviewed and stable Respiratory status: spontaneous breathing, nonlabored ventilation, respiratory function stable and patient connected to nasal cannula oxygen Cardiovascular status: blood pressure returned to baseline and stable Postop Assessment: no apparent nausea or vomiting Anesthetic complications: no   No notable events documented.   Last Vitals:  Vitals:   10/29/23 0757 10/29/23 0807  BP: 114/73 128/77  Pulse: 89 77  Resp:  17  Temp:    SpO2: 100% 100%    Last Pain:  Vitals:   10/29/23 0807  TempSrc:   PainSc: 0-No pain                 Nancey Awkward

## 2023-10-29 NOTE — Transfer of Care (Signed)
 Immediate Anesthesia Transfer of Care Note  Patient: Sheila Parsons  Procedure(s) Performed: COLONOSCOPY WITH PROPOFOL   Patient Location: Endoscopy Unit  Anesthesia Type:General  Level of Consciousness: awake, alert , and oriented  Airway & Oxygen Therapy: Patient Spontanous Breathing  Post-op Assessment: Report given to RN and Post -op Vital signs reviewed and stable  Post vital signs: Reviewed and stable  Last Vitals:  Vitals Value Taken Time  BP 114/73 10/29/23 0757  Temp    Pulse 88 10/29/23 0758  Resp 9 10/29/23 0758  SpO2 99 % 10/29/23 0758  Vitals shown include unfiled device data.  Last Pain:  Vitals:   10/29/23 0757  TempSrc:   PainSc: 0-No pain         Complications: No notable events documented.

## 2023-10-29 NOTE — Anesthesia Preprocedure Evaluation (Signed)
 Anesthesia Evaluation  Patient identified by MRN, date of birth, ID band Patient awake    Reviewed: Allergy & Precautions, NPO status , Patient's Chart, lab work & pertinent test results  History of Anesthesia Complications Negative for: history of anesthetic complications  Airway Mallampati: II  TM Distance: >3 FB Neck ROM: full    Dental no notable dental hx.    Pulmonary COPD   Pulmonary exam normal        Cardiovascular hypertension, On Medications negative cardio ROS Normal cardiovascular exam     Neuro/Psych negative neurological ROS  negative psych ROS   GI/Hepatic negative GI ROS, Neg liver ROS,,,  Endo/Other  negative endocrine ROS    Renal/GU negative Renal ROS  negative genitourinary   Musculoskeletal   Abdominal   Peds  Hematology negative hematology ROS (+)   Anesthesia Other Findings Past Medical History: No date: Hypertension  Past Surgical History: No date: BREAST EXCISIONAL BIOPSY; Left     Comment:  40 years ago per pt-benign  BMI    Body Mass Index: 30.73 kg/m      Reproductive/Obstetrics negative OB ROS                             Anesthesia Physical Anesthesia Plan  ASA: 2  Anesthesia Plan: General   Post-op Pain Management: Minimal or no pain anticipated   Induction: Intravenous  PONV Risk Score and Plan: 2 and Propofol  infusion and TIVA  Airway Management Planned: Natural Airway and Nasal Cannula  Additional Equipment:   Intra-op Plan:   Post-operative Plan:   Informed Consent: I have reviewed the patients History and Physical, chart, labs and discussed the procedure including the risks, benefits and alternatives for the proposed anesthesia with the patient or authorized representative who has indicated his/her understanding and acceptance.     Dental Advisory Given  Plan Discussed with: Anesthesiologist, CRNA and Surgeon  Anesthesia  Plan Comments: (Patient consented for risks of anesthesia including but not limited to:  - adverse reactions to medications - risk of airway placement if required - damage to eyes, teeth, lips or other oral mucosa - nerve damage due to positioning  - sore throat or hoarseness - Damage to heart, brain, nerves, lungs, other parts of body or loss of life  Patient voiced understanding and assent.)       Anesthesia Quick Evaluation

## 2023-10-29 NOTE — H&P (Signed)
 Subjective:   CC: Ventral hernia with obstruction and without gangrene [K43.6]  HPI: Sheila Parsons is a 63 y.o. female who was referred by Genny Kid,* for evaluation of above. Symptoms were first noted a few years ago. Asymptomatic, cannot recall specific incident. Cannot recall if any change in size recently.  Past Medical History: has a past medical history of Allergy and Hypertension.  Past Surgical History: History reviewed. No pertinent surgical history.  Family History: family history includes COPD in her father; No Known Problems in her mother.  Social History: reports that she has never smoked. She has never used smokeless tobacco. She reports that she does not drink alcohol and does not use drugs.  Current Medications: has a current medication list which includes the following prescription(s): albuterol  mdi (proventil , ventolin , proair ) hfa, amlodipine , hydrochlorothiazide , triamcinolone, and montelukast.  Allergies:  Allergies as of 08/25/2023  (No Known Allergies)   ROS:  A 15 point review of systems was performed and pertinent positives and negatives noted in HPI  Objective:    BP (!) 144/88  Pulse 86  Ht 162.6 cm (5' 4.02")  Wt 83.6 kg (184 lb 4.9 oz)  BMI 31.62 kg/m   Constitutional : Alert, cooperative, no distress  Lymphatics/Throat: Supple, no lymphadenopathy  Respiratory: clear to auscultation bilaterally  Cardiovascular: regular rate and rhythm  Gastrointestinal: soft, non-tender; bowel sounds normal; no masses, no organomegaly. ventral hernia noted. large, incarcerated, and no overlying skin changes  Musculoskeletal: Steady gait and movement  Skin: Cool and moist  Psychiatric: Normal affect, non-agitated, not confused    LABS:  N/a   RADS: N/a Assessment:    Ventral hernia with obstruction and without gangrene [K43.6] Possible incisional from lap tubal ligation,but very large hernia contents  Encounter for screening  colonoscopy  Plan:    1. Ventral hernia with obstruction and without gangrene [K43.6] - CT to assess full defect size in anticipation for surgery, may need to defer to tertiary care center  Screening colonoscopy- will determine timing based on hernia plan above  labs/images/medications/previous chart entries reviewed personally and relevant changes/updates noted above.

## 2023-10-29 NOTE — Op Note (Signed)
 [facility] Gastroenterology Patient Name: Sheila Parsons Procedure Date: 10/29/2023 7:39 AM MRN: [id number] Account #: [id number] Date of Birth: [birth year] Admit Type: Outpatient Age: 63 Room: [facility] ENDO ROOM 2 Gender: Female Note Status: Finalized Instrument Name: Hyman Main [id number] Procedure:             Colonoscopy Indications:           Screening for colorectal malignant neoplasm Providers:             Conrado Delay MD, MD Referring MD:          Miriam J. Mclaughlin, MD (Referring MD) Medicines:             Propofol  per Anesthesia Complications:         No immediate complications. Procedure:             Pre-Anesthesia Assessment:                        - After reviewing the risks and benefits, the patient                         was deemed in satisfactory condition to undergo the                         procedure in an ambulatory setting.                        After obtaining informed consent, the colonoscope was                         passed under direct vision. Throughout the procedure,                         the patient's blood pressure, pulse, and oxygen                         saturations were monitored continuously. The                         Colonoscope was introduced through the anus and                         advanced to the the transverse colon, where ventral                         hernia encountered with colon within sac. attempt made                         to transverese but unsuccessful. procedure therfore                         aborted. The colonoscopy was performed without                         difficulty. The patient tolerated the procedure well.                         The quality of the bowel preparation was good. Findings:      The exam was otherwise normal throughout the examined colon up to the  hernia site.      Non-bleeding internal hemorrhoids were found during retroflexion. The       hemorrhoids were Grade I (internal  hemorrhoids that do not prolapse). Impression:            - Non-bleeding internal hemorrhoids.                        - No specimens collected. Recommendation:        - Written discharge instructions were provided to the                         patient.                        - Discharge patient to home.                        - Resume previous diet.                        - Repeat colonoscopy after hernia repair to complete                         for screening purposes. Procedure Code(s):     --- Professional ---                        [phone], 53, Colonoscopy, flexible; diagnostic,                         including collection of specimen(s) by brushing or                         washing, when performed (separate procedure) Diagnosis Code(s):     --- Professional ---                        K64.0, First degree hemorrhoids                        Z12.11, Encounter for screening for malignant neoplasm                         of colon CPT copyright 2022 American [employer] Association. All rights reserved. The codes documented in this report are preliminary and upon coder review may  be revised to meet current compliance requirements. Dr. Ward Guy, MD Conrado Delay MD, MD 10/29/2023 8:11:41 AM This report has been signed electronically. Number of Addenda: 0 Note Initiated On: 10/29/2023 7:39 AM Scope Withdrawal Time: 0 hours 3 minutes 26 seconds  Total Procedure Duration: 0 hours 7 minutes 46 seconds  Estimated Blood Loss:  Estimated blood loss: none.      [facility]

## 2023-10-29 NOTE — Anesthesia Procedure Notes (Signed)
 Date/Time: 10/29/2023 7:41 AM  Performed by: Angelia Kelp, CRNAPre-anesthesia Checklist: Patient identified, Emergency Drugs available, Suction available, Patient being monitored and Timeout performed Patient Re-evaluated:Patient Re-evaluated prior to induction Oxygen Delivery Method: Nasal cannula Preoxygenation: Pre-oxygenation with 100% oxygen Induction Type: IV induction

## 2023-10-30 ENCOUNTER — Encounter: Payer: Self-pay | Admitting: Surgery

## 2023-11-04 ENCOUNTER — Other Ambulatory Visit: Payer: 59

## 2023-12-05 ENCOUNTER — Encounter
Admission: RE | Admit: 2023-12-05 | Discharge: 2023-12-05 | Disposition: A | Discharge status: Home / Self Care | Payer: 59 | Source: Ambulatory Visit | Attending: Surgery | Admitting: Surgery

## 2023-12-05 ENCOUNTER — Ambulatory Visit: Payer: Self-pay | Admitting: Surgery

## 2023-12-05 VITALS — Ht 64.0 in | Wt 184.3 lb

## 2023-12-05 DIAGNOSIS — Z01812 Encounter for preprocedural laboratory examination: Secondary | ICD-10-CM

## 2023-12-05 DIAGNOSIS — Z01810 Encounter for preprocedural cardiovascular examination: Secondary | ICD-10-CM

## 2023-12-05 DIAGNOSIS — I10 Essential (primary) hypertension: Secondary | ICD-10-CM

## 2023-12-05 DIAGNOSIS — J441 Chronic obstructive pulmonary disease with (acute) exacerbation: Secondary | ICD-10-CM

## 2023-12-05 HISTORY — DX: Other seasonal allergic rhinitis: J30.2

## 2023-12-05 HISTORY — DX: Chronic obstructive pulmonary disease, unspecified: J44.9

## 2023-12-05 HISTORY — DX: Ventral hernia without obstruction or gangrene: K43.9

## 2023-12-05 HISTORY — DX: Prediabetes: R73.03

## 2023-12-05 NOTE — H&P (Signed)
Subjective:   CC: Ventral hernia with obstruction and without gangrene [K43.6]  HPI: Annsleigh Dragoo is a 63 y.o. female who was referred by Loletha Carrow,* for evaluation of above. Symptoms were first noted a few years ago. Asymptomatic, cannot recall specific incident. Cannot recall if any change in size recently.  Past Medical History: has a past medical history of Allergy and Hypertension.  Past Surgical History: History reviewed. No pertinent surgical history.  Family History: family history includes COPD in her father; No Known Problems in her mother.  Social History: reports that she has never smoked. She has never used smokeless tobacco. She reports that she does not drink alcohol and does not use drugs.  Current Medications: has a current medication list which includes the following prescription(s): albuterol mdi (proventil, ventolin, proair) hfa, amlodipine, hydrochlorothiazide, triamcinolone, and montelukast.  Allergies:  Allergies as of 08/25/2023  (No Known Allergies)   ROS:  A 15 point review of systems was performed and pertinent positives and negatives noted in HPI  Objective:    BP (!) 144/88  Pulse 86  Ht 162.6 cm (5' 4.02")  Wt 83.6 kg (184 lb 4.9 oz)  BMI 31.62 kg/m   Constitutional : Alert, cooperative, no distress  Lymphatics/Throat: Supple, no lymphadenopathy  Respiratory: clear to auscultation bilaterally  Cardiovascular: regular rate and rhythm  Gastrointestinal: soft, non-tender; bowel sounds normal; no masses, no organomegaly. ventral hernia noted. large, incarcerated, and no overlying skin changes  Musculoskeletal: Steady gait and movement  Skin: Cool and moist  Psychiatric: Normal affect, non-agitated, not confused    LABS:  N/a   RADS: N/a Assessment:    Ventral hernia with obstruction and without gangrene [K43.6] Possible incisional from lap tubal ligation,but very large hernia contents  Encounter for screening  colonoscopy  Plan:    1. Ventral hernia with obstruction and without gangrene [K43.6] - CT to assess full defect size in anticipation for surgery  Screening colonoscopy- will determine timing based on hernia plan above.  Update.  CT reviewed.  Will proceed with surgery.  Discussed the risk of surgery including recurrence, which can be up to 50% in the case of incisional or complex hernias, possible use of prosthetic materials (mesh) and the increased risk of mesh infxn if used, bleeding, chronic pain, post-op infxn, post-op SBO or ileus, and possible re-operation to address said risks. The risks of general anesthetic, if used, includes MI, CVA, sudden death or even reaction to anesthetic medications also discussed. Alternatives include continued observation.  Benefits include possible symptom relief, prevention of incarceration, strangulation, enlargement in size over time, and the risk of emergency surgery in the face of strangulation.  Typical post-op recovery time of 3-5 days with 4-6 weeks of activity restrictions were also discussed.  The patient verbalized understanding and all questions were answered to the patient's satisfaction.   labs/images/medications/previous chart entries reviewed personally and relevant changes/updates noted above.

## 2023-12-05 NOTE — Patient Instructions (Signed)
Your procedure is scheduled on:12-11-23 Thursday Report to the Registration Desk on the 1st floor of the Medical Mall.Then proceed to the 2nd floor Surgery Desk To find out your arrival time, please call [phone] between 1PM - 3PM on:12-10-23 Wednesday If your arrival time is 6:00 am, do not arrive before that time as the Medical Mall entrance doors do not open until 6:00 am.  REMEMBER: Instructions that are not followed completely may result in serious medical risk, up to and including death; or upon the discretion of your surgeon and anesthesiologist your surgery may need to be rescheduled.  Do not eat food after midnight the night before surgery.  No gum chewing or hard candies.  You may however, drink CLEAR liquids up to 2 hours before you are scheduled to arrive for your surgery. Do not drink anything within 2 hours of your scheduled arrival time.  Clear liquids include: - water  - apple juice without pulp - gatorade (not RED colors) - black coffee or tea (Do NOT add milk or creamers to the coffee or tea) Do NOT drink anything that is not on this list.  One week prior to surgery:Stop NOW (12-05-23) Stop Anti-inflammatories (NSAIDS) such as Advil, Aleve, Ibuprofen, Motrin, Naproxen, Naprosyn and Aspirin based products such as Excedrin, Goody's Powder, BC Powder. Stop ANY OVER THE COUNTER supplements until after surgery.  You may however, continue to take Tylenol if needed for pain up until the day of surgery.  Continue taking all of your other prescription medications up until the day of surgery.  ON THE DAY OF SURGERY ONLY TAKE THESE MEDICATIONS WITH SIPS OF WATER: -amLODipine (NORVASC)   No Alcohol for 24 hours before or after surgery.  No Smoking including e-cigarettes for 24 hours before surgery.  No chewable tobacco products for at least 6 hours before surgery.  No nicotine patches on the day of surgery.  Do not use any "recreational" drugs for at least a week  (preferably 2 weeks) before your surgery.  Please be advised that the combination of cocaine and anesthesia may have negative outcomes, up to and including death. If you test positive for cocaine, your surgery will be cancelled.  On the morning of surgery brush your teeth with toothpaste and water, you may rinse your mouth with mouthwash if you wish. Do not swallow any toothpaste or mouthwash.  Use CHG Soap as directed on instruction sheet.  Do not wear jewelry, make-up, hairpins, clips or nail polish.  For welded (permanent) jewelry: bracelets, anklets, waist bands, etc.  Please have this removed prior to surgery.  If it is not removed, there is a chance that hospital personnel will need to cut it off on the day of surgery.  Do not wear lotions, powders, or perfumes.   Do not shave body hair from the neck down 48 hours before surgery.  Contact lenses, hearing aids and dentures may not be worn into surgery.  Do not bring valuables to the hospital. [facility] is not responsible for any missing/lost belongings or valuables. t.   Notify your doctor if there is any change in your medical condition (cold, fever, infection).  Wear comfortable clothing (specific to your surgery type) to the hospital.  After surgery, you can help prevent lung complications by doing breathing exercises.  Take deep breaths and cough every 1-2 hours. Your doctor may order a device called an Incentive Spirometer to help you take deep breaths. When coughing or sneezing, hold a pillow firmly against your  incision with both hands. This is called "splinting." Doing this helps protect your incision. It also decreases belly discomfort.  If you are being admitted to the hospital overnight, leave your suitcase in the car. After surgery it may be brought to your room.  In case of increased patient census, it may be necessary for you, the patient, to continue your postoperative care in the Same Day Surgery  department.  If you are being discharged the day of surgery, you will not be allowed to drive home. You will need a responsible individual to drive you home and stay with you for 24 hours after surgery.   If you are taking public transportation, you will need to have a responsible individual with you.  Please call the Pre-admissions Testing Dept. at [phone] if you have any questions about these instructions.  Surgery Visitation Policy:  Patients having surgery or a procedure may have two visitors.  Children under the age of 76 must have an adult with them who is not the patient.  Temporary Visitor Restrictions Due to increasing cases of flu, RSV and COVID-19: Children ages 40 and under will not be able to visit patients in [facility] hospitals under most circumstances.     Preparing for Surgery with CHLORHEXIDINE GLUCONATE (CHG) Soap  Chlorhexidine Gluconate (CHG) Soap  o An antiseptic cleaner that kills germs and bonds with the skin to continue killing germs even after washing  o Used for showering the night before surgery and morning of surgery  Before surgery, you can play an important role by reducing the number of germs on your skin.  CHG (Chlorhexidine gluconate) soap is an antiseptic cleanser which kills germs and bonds with the skin to continue killing germs even after washing.  Please do not use if you have an allergy to CHG or antibacterial soaps. If your skin becomes reddened/irritated stop using the CHG.  1. Shower the NIGHT BEFORE SURGERY and the MORNING OF SURGERY with CHG soap.  2. If you choose to wash your hair, wash your hair first as usual with your normal shampoo.  3. After shampooing, rinse your hair and body thoroughly to remove the shampoo.  4. Use CHG as you would any other liquid soap. You can apply CHG directly to the skin and wash gently with a scrungie or a clean washcloth.  5. Apply the CHG soap to your body only from the neck down. Do  not use on open wounds or open sores. Avoid contact with your eyes, ears, mouth, and genitals (private parts). Wash face and genitals (private parts) with your normal soap.  6. Wash thoroughly, paying special attention to the area where your surgery will be performed.  7. Thoroughly rinse your body with warm water.  8. Do not shower/wash with your normal soap after using and rinsing off the CHG soap.  9. Pat yourself dry with a clean towel.  10. Wear clean pajamas to bed the night before surgery.  12. Place clean sheets on your bed the night of your first shower and do not sleep with pets.  13. Shower again with the CHG soap on the day of surgery prior to arriving at the hospital.  14. Do not apply any deodorants/lotions/powders.  15. Please wear clean clothes to the hospital.

## 2023-12-08 ENCOUNTER — Encounter
Admission: RE | Admit: 2023-12-08 | Discharge: 2023-12-08 | Disposition: A | Discharge status: Home / Self Care | Payer: 59 | Source: Ambulatory Visit | Attending: Surgery | Admitting: Surgery

## 2023-12-08 ENCOUNTER — Telehealth: Payer: Self-pay | Admitting: Urgent Care

## 2023-12-08 DIAGNOSIS — Z01810 Encounter for preprocedural cardiovascular examination: Secondary | ICD-10-CM | POA: Diagnosis not present

## 2023-12-08 DIAGNOSIS — Z01818 Encounter for other preprocedural examination: Secondary | ICD-10-CM | POA: Insufficient documentation

## 2023-12-08 DIAGNOSIS — Z01812 Encounter for preprocedural laboratory examination: Secondary | ICD-10-CM

## 2023-12-08 DIAGNOSIS — I10 Essential (primary) hypertension: Secondary | ICD-10-CM | POA: Diagnosis not present

## 2023-12-08 DIAGNOSIS — E876 Hypokalemia: Secondary | ICD-10-CM

## 2023-12-08 DIAGNOSIS — J441 Chronic obstructive pulmonary disease with (acute) exacerbation: Secondary | ICD-10-CM | POA: Insufficient documentation

## 2023-12-08 LAB — CBC
HCT: 39.5 % (ref 36.0–46.0)
Hemoglobin: 13.3 g/dL (ref 12.0–15.0)
MCH: 29.8 pg (ref 26.0–34.0)
MCHC: 33.7 g/dL (ref 30.0–36.0)
MCV: 88.6 fL (ref 80.0–100.0)
Platelets: 400 10*3/uL (ref 150–400)
RBC: 4.46 MIL/uL (ref 3.87–5.11)
RDW: 12.7 % (ref 11.5–15.5)
WBC: 6.3 10*3/uL (ref 4.0–10.5)
nRBC: 0 % (ref 0.0–0.2)

## 2023-12-08 LAB — BASIC METABOLIC PANEL
Anion gap: 10 (ref 5–15)
BUN: 10 mg/dL (ref 8–23)
CO2: 25 mmol/L (ref 22–32)
Calcium: 9.3 mg/dL (ref 8.9–10.3)
Chloride: 104 mmol/L (ref 98–111)
Creatinine, Ser: 0.61 mg/dL (ref 0.44–1.00)
GFR, Estimated: >60 mL/min (ref >60)
Glucose, Bld: 105 mg/dL — ABNORMAL HIGH (ref 70–99)
Potassium: 3.1 mmol/L — ABNORMAL LOW (ref 3.5–5.1)
Sodium: 139 mmol/L (ref 135–145)

## 2023-12-08 LAB — MAGNESIUM: Magnesium: 2.3 mg/dL (ref 1.7–2.4)

## 2023-12-08 MED ORDER — POTASSIUM CHLORIDE CRYS ER 20 MEQ PO TBCR: EXTENDED_RELEASE_TABLET | ORAL | 0 refills | Status: DC

## 2023-12-08 NOTE — H&P (View-Only) (Signed)
 [facility] Perioperative Services: Pre-Admission/Anesthesia Testing  Abnormal Lab Notification and Treatment Plan of Care   Date: 12/08/23  Name: Sheila Parsons MRN:   [id number]  Re: Abnormal labs noted during PAT appointment   Notified:  Provider Name Provider Role Notification Mode  Sung Amabile, DO General Surgery (Surgeon) Routed and/or faxed via Esmond Plants, Merleen Milliner, MD Primary Care Provider Routed and/or faxed via [facility]   Clinical Information and Notes:  ABNORMAL LAB VALUE(S): Lab Results  Component Value Date   K 3.1 (L) 12/08/2023   Sheila Parsons is scheduled for an elective XI ROBOTIC ASSISTED VENTRAL HERNIA on 12/11/2023. In review of her medication reconciliation, it is noted that the patient is not taking prescribed diuretic medications.  Please note, in efforts to promote a safe and effective anesthetic course, per current guidelines/standards set by the [facility] anesthesia team, the minimal acceptable K+ level for the patient to proceed with general anesthesia is 3.0 mmol/L. With that being said, if the patient drops any lower, her elective procedure will need to be postponed until K+ is better optimized. In efforts to prevent case cancellation, and ultimately to promote the safety of this patient undergoing sedation/anesthesia, will make efforts to optimize pre-surgical K+ level allowing the surgical intervention to proceed as planned.    Impression and Plan:  Sheila Parsons found to be HYPOkalemic at 3.1 mmol/L on preoperative labs.   Mg level was added and found to be normal at: 2.3 mg/dL  She is not on diuretic therapy. Discussed unknown etiology in the absence of diuretic use and GI symptoms.  Patient has a normal magnesium level.  She denies regular use of laxative medications. Reviewed other potential causes, with the main differential being decreased dietary intake of K+ rich foods and other insensible losses.   Reviewed plans for  preoperative optimization as follows.  Based on an average of 0.12 mmol/L increase per 10 mEq KCl, will replace with 20 mEq daily from 02/24 -02/27.  This, in theory, should allow for patient to increase to the 4.0 - 4.1 mmol/L range (0.24 mmol/L x 4 days + 3.1 mmol/L = 4.0 mmol/L).  Meds ordered this encounter  Medications   potassium chloride SA (KLOR-CON M) 20 MEQ tablet    Sig: Take 1 tablet (20 mEq) daily until surgery. Be sure to take dose on day of surgery. Follow up with PCP for repeat labs.    Dispense:  4 tablet    Refill:  0    Please contact patient when Rx is available to pick up.  Rx is for preoperative K+ optimization and needs to be started ASAP.   Encouraged patient to follow up with PCP about 2-3 weeks postoperatively to have labs rechecked to ensure that levels are remaining within normal range. Discussed nutritional intake of K+ rich foods as an adjunctive way to keep her K+ levels normal; list of K+ rich foods provided. Also mentioned ORS, however advised her not to rely solely on these drinks, as they are high in Na+, and she has a HTN diagnosis.   Will send copy of this note to surgeon and PCP to make them aware of K+ level and plans for correction. Discussed that PCP may elect to add a daily K+ supplement if levels remain low on recheck. Order entered to recheck K+ on the day of her surgery to ensure optimization. Wished patient the best of luck with her upcoming surgery and subsequent recovery. She was encouraged to return call  to the PAT clinic, or to her surgeon's office, should any questions or concerns arise between now and the time of her surgery. Patient was appreciative of the care/concern expressed by PAT staff.   Encounter Diagnoses  Name Primary?   Pre-operative laboratory examination Yes   Hypokalemia    Quentin Mulling, MSN, APRN, FNP-C, CEN [facility]  Perioperative Services Nurse Practitioner Phone: [phone] 12/08/23 1:50 PM  NOTE:  This note has been prepared using Dragon dictation software. Despite my best ability to proofread, there is always the potential that unintentional transcriptional errors may still occur from this process.

## 2023-12-08 NOTE — Progress Notes (Signed)
 [facility] Perioperative Services: Pre-Admission/Anesthesia Testing  Abnormal Lab Notification and Treatment Plan of Care   Date: 12/08/23  Name: Sheila Parsons MRN:   [id number]  Re: Abnormal labs noted during PAT appointment   Notified:  Provider Name Provider Role Notification Mode  Sung Amabile, DO General Surgery (Surgeon) Routed and/or faxed via [facility] [employer], Merleen Milliner, MD Primary Care Provider Routed and/or faxed via [facility]   Clinical Information and Notes:  ABNORMAL LAB VALUE(S): Lab Results  Component Value Date   K 3.1 (L) 12/08/2023   Sheila Parsons is scheduled for an elective XI ROBOTIC ASSISTED VENTRAL HERNIA on 12/11/2023. In review of her medication reconciliation, it is noted that the patient is not taking prescribed diuretic medications.  Please note, in efforts to promote a safe and effective anesthetic course, per current guidelines/standards set by the [facility] anesthesia team, the minimal acceptable K+ level for the patient to proceed with general anesthesia is 3.0 mmol/L. With that being said, if the patient drops any lower, her elective procedure will need to be postponed until K+ is better optimized. In efforts to prevent case cancellation, and ultimately to promote the safety of this patient undergoing sedation/anesthesia, will make efforts to optimize pre-surgical K+ level allowing the surgical intervention to proceed as planned.    Impression and Plan:  Sheila Parsons found to be HYPOkalemic at 3.1 mmol/L on preoperative labs.   Mg level was added and found to be normal at: 2.3 mg/dL  She is not on diuretic therapy. Discussed unknown etiology in the absence of diuretic use and GI symptoms.  Patient has a normal magnesium level.  She denies regular use of laxative medications. Reviewed other potential causes, with the main differential being decreased dietary intake of K+ rich foods and other insensible losses.   Reviewed plans for  preoperative optimization as follows.  Based on an average of 0.12 mmol/L increase per 10 mEq KCl, will replace with 20 mEq daily from 02/24 -02/27.  This, in theory, should allow for patient to increase to the 4.0 - 4.1 mmol/L range (0.24 mmol/L x 4 days + 3.1 mmol/L = 4.0 mmol/L).  Meds ordered this encounter  Medications   potassium chloride SA (KLOR-CON M) 20 MEQ tablet    Sig: Take 1 tablet (20 mEq) daily until surgery. Be sure to take dose on day of surgery. Follow up with PCP for repeat labs.    Dispense:  4 tablet    Refill:  0    Please contact patient when Rx is available to pick up.  Rx is for preoperative K+ optimization and needs to be started ASAP.   Encouraged patient to follow up with PCP about 2-3 weeks postoperatively to have labs rechecked to ensure that levels are remaining within normal range. Discussed nutritional intake of K+ rich foods as an adjunctive way to keep her K+ levels normal; list of K+ rich foods provided. Also mentioned ORS, however advised her not to rely solely on these drinks, as they are high in Na+, and she has a HTN diagnosis.   Will send copy of this note to surgeon and PCP to make them aware of K+ level and plans for correction. Discussed that PCP may elect to add a daily K+ supplement if levels remain low on recheck. Order entered to recheck K+ on the day of her surgery to ensure optimization. Wished patient the best of luck with her upcoming surgery and subsequent recovery. She was encouraged to return call  to the PAT clinic, or to her surgeon's office, should any questions or concerns arise between now and the time of her surgery. Patient was appreciative of the care/concern expressed by PAT staff.   Encounter Diagnoses  Name Primary?   Pre-operative laboratory examination Yes   Hypokalemia    Quentin Mulling, MSN, APRN, FNP-C, CEN [facility]  Perioperative Services Nurse Practitioner Phone: [phone] 12/08/23 1:50 PM  NOTE:  This note has been prepared using Dragon dictation software. Despite my best ability to proofread, there is always the potential that unintentional transcriptional errors may still occur from this process.

## 2023-12-10 MED ORDER — ACETAMINOPHEN 500 MG PO TABS
1000.0000 mg | ORAL_TABLET | ORAL | Status: AC
  Administered 2023-12-11: 1000 mg via ORAL

## 2023-12-10 MED ORDER — CEFAZOLIN SODIUM-DEXTROSE 2-4 GM/100ML-% IV SOLN
2.0000 g | INTRAVENOUS | Status: AC
  Administered 2023-12-11: 2 g via INTRAVENOUS

## 2023-12-10 MED ORDER — CHLORHEXIDINE GLUCONATE 0.12 % MT SOLN
15.0000 mL | Freq: Once | OROMUCOSAL | Status: AC
  Administered 2023-12-11: 15 mL via OROMUCOSAL

## 2023-12-10 MED ORDER — LACTATED RINGERS IV SOLN: INTRAVENOUS | Status: DC

## 2023-12-10 MED ORDER — ORAL CARE MOUTH RINSE: 15.0000 mL | Freq: Once | OROMUCOSAL | Status: AC

## 2023-12-10 MED ORDER — CHLORHEXIDINE GLUCONATE CLOTH 2 % EX PADS
6.0000 | MEDICATED_PAD | Freq: Once | CUTANEOUS | Status: AC
  Administered 2023-12-11: 6 via TOPICAL

## 2023-12-10 MED ORDER — GABAPENTIN 300 MG PO CAPS
300.0000 mg | ORAL_CAPSULE | ORAL | Status: AC
  Administered 2023-12-11: 300 mg via ORAL

## 2023-12-10 MED ORDER — CELECOXIB 200 MG PO CAPS
200.0000 mg | ORAL_CAPSULE | ORAL | Status: AC
  Administered 2023-12-11: 200 mg via ORAL

## 2023-12-11 ENCOUNTER — Ambulatory Visit: Payer: Self-pay | Admitting: Urgent Care

## 2023-12-11 ENCOUNTER — Encounter
Admission: RE | Disposition: A | Discharge status: Home / Self Care | Payer: Self-pay | Source: Home / Self Care | Attending: Surgery

## 2023-12-11 ENCOUNTER — Encounter: Payer: Self-pay | Admitting: Surgery

## 2023-12-11 ENCOUNTER — Other Ambulatory Visit: Payer: Self-pay

## 2023-12-11 ENCOUNTER — Ambulatory Visit
Admission: RE | Admit: 2023-12-11 | Discharge: 2023-12-11 | Disposition: A | Discharge status: Home / Self Care | Payer: 59 | Attending: Surgery | Admitting: Surgery

## 2023-12-11 DIAGNOSIS — E876 Hypokalemia: Secondary | ICD-10-CM | POA: Insufficient documentation

## 2023-12-11 DIAGNOSIS — Z01812 Encounter for preprocedural laboratory examination: Secondary | ICD-10-CM

## 2023-12-11 DIAGNOSIS — K436 Other and unspecified ventral hernia with obstruction, without gangrene: Secondary | ICD-10-CM | POA: Insufficient documentation

## 2023-12-11 DIAGNOSIS — I10 Essential (primary) hypertension: Secondary | ICD-10-CM | POA: Insufficient documentation

## 2023-12-11 DIAGNOSIS — K439 Ventral hernia without obstruction or gangrene: Secondary | ICD-10-CM

## 2023-12-11 HISTORY — PX: XI ROBOTIC ASSISTED VENTRAL HERNIA: SHX6789

## 2023-12-11 HISTORY — PX: INSERTION OF MESH: SHX5868

## 2023-12-11 LAB — POCT I-STAT, CHEM 8
BUN: 6 mg/dL — ABNORMAL LOW (ref 8–23)
Calcium, Ion: 1.19 mmol/L (ref 1.15–1.40)
Chloride: 106 mmol/L (ref 98–111)
Creatinine, Ser: 0.5 mg/dL (ref 0.44–1.00)
Glucose, Bld: 109 mg/dL — ABNORMAL HIGH (ref 70–99)
HCT: 37 % (ref 36.0–46.0)
Hemoglobin: 12.6 g/dL (ref 12.0–15.0)
Potassium: 3.6 mmol/L (ref 3.5–5.1)
Sodium: 140 mmol/L (ref 135–145)
TCO2: 27 mmol/L (ref 22–32)

## 2023-12-11 SURGERY — REPAIR, HERNIA, VENTRAL, ROBOT-ASSISTED
Anesthesia: General | Site: Abdomen

## 2023-12-11 MED ORDER — OXYCODONE-ACETAMINOPHEN 5-325 MG PO TABS: 1.0000 | ORAL_TABLET | Freq: Three times a day (TID) | ORAL | 0 refills | Status: AC | PRN

## 2023-12-11 MED ORDER — PROPOFOL 10 MG/ML IV BOLUS
INTRAVENOUS | Status: AC
  Filled 2023-12-11: qty 20

## 2023-12-11 MED ORDER — FENTANYL CITRATE (PF) 100 MCG/2ML IJ SOLN
25.0000 ug | INTRAMUSCULAR | Status: DC | PRN
  Administered 2023-12-11: 50 ug via INTRAVENOUS
  Administered 2023-12-11 (×2): 25 ug via INTRAVENOUS

## 2023-12-11 MED ORDER — DEXAMETHASONE SODIUM PHOSPHATE 10 MG/ML IJ SOLN
INTRAMUSCULAR | Status: DC | PRN
  Administered 2023-12-11: 10 mg via INTRAVENOUS

## 2023-12-11 MED ORDER — ACETAMINOPHEN 500 MG PO TABS
ORAL_TABLET | ORAL | Status: AC
  Filled 2023-12-11: qty 2

## 2023-12-11 MED ORDER — ONDANSETRON HCL 4 MG/2ML IJ SOLN: 4.0000 mg | Freq: Once | INTRAMUSCULAR | Status: DC | PRN

## 2023-12-11 MED ORDER — PHENYLEPHRINE 80 MCG/ML (10ML) SYRINGE FOR IV PUSH (FOR BLOOD PRESSURE SUPPORT)
PREFILLED_SYRINGE | INTRAVENOUS | Status: AC
  Filled 2023-12-11: qty 10

## 2023-12-11 MED ORDER — FENTANYL CITRATE (PF) 100 MCG/2ML IJ SOLN
INTRAMUSCULAR | Status: AC
  Filled 2023-12-11: qty 2

## 2023-12-11 MED ORDER — EPHEDRINE SULFATE-NACL 50-0.9 MG/10ML-% IV SOSY
PREFILLED_SYRINGE | INTRAVENOUS | Status: DC | PRN
  Administered 2023-12-11: 5 mg via INTRAVENOUS

## 2023-12-11 MED ORDER — ROCURONIUM BROMIDE 10 MG/ML (PF) SYRINGE
PREFILLED_SYRINGE | INTRAVENOUS | Status: AC
  Filled 2023-12-11: qty 10

## 2023-12-11 MED ORDER — CHLORHEXIDINE GLUCONATE 0.12 % MT SOLN
OROMUCOSAL | Status: AC
  Filled 2023-12-11: qty 15

## 2023-12-11 MED ORDER — CEFAZOLIN SODIUM-DEXTROSE 2-4 GM/100ML-% IV SOLN
INTRAVENOUS | Status: AC
  Filled 2023-12-11: qty 100

## 2023-12-11 MED ORDER — ACETAMINOPHEN 325 MG PO TABS: 650.0000 mg | ORAL_TABLET | Freq: Three times a day (TID) | ORAL | 0 refills | Status: AC | PRN

## 2023-12-11 MED ORDER — CELECOXIB 200 MG PO CAPS
ORAL_CAPSULE | ORAL | Status: AC
  Filled 2023-12-11: qty 1

## 2023-12-11 MED ORDER — DOCUSATE SODIUM 100 MG PO CAPS: 100.0000 mg | ORAL_CAPSULE | Freq: Two times a day (BID) | ORAL | 0 refills | Status: AC | PRN

## 2023-12-11 MED ORDER — SODIUM CHLORIDE (PF) 0.9 % IJ SOLN
INTRAMUSCULAR | Status: AC
  Filled 2023-12-11: qty 50

## 2023-12-11 MED ORDER — FENTANYL CITRATE (PF) 100 MCG/2ML IJ SOLN
INTRAMUSCULAR | Status: DC | PRN
  Administered 2023-12-11 (×2): 50 ug via INTRAVENOUS

## 2023-12-11 MED ORDER — SODIUM CHLORIDE (PF) 0.9 % IJ SOLN
INTRAMUSCULAR | Status: DC | PRN
  Administered 2023-12-11: 80 mL

## 2023-12-11 MED ORDER — OXYCODONE HCL 5 MG PO TABS
ORAL_TABLET | ORAL | Status: AC
  Filled 2023-12-11: qty 1

## 2023-12-11 MED ORDER — ACETAMINOPHEN 10 MG/ML IV SOLN: 1000.0000 mg | Freq: Once | INTRAVENOUS | Status: DC | PRN

## 2023-12-11 MED ORDER — PHENYLEPHRINE HCL-NACL 20-0.9 MG/250ML-% IV SOLN
INTRAVENOUS | Status: AC
Start: 2023-12-11 — End: ?
  Filled 2023-12-11: qty 250

## 2023-12-11 MED ORDER — BUPIVACAINE LIPOSOME 1.3 % IJ SUSP
INTRAMUSCULAR | Status: AC
  Filled 2023-12-11: qty 20

## 2023-12-11 MED ORDER — ONDANSETRON HCL 4 MG/2ML IJ SOLN
INTRAMUSCULAR | Status: DC | PRN
  Administered 2023-12-11: 4 mg via INTRAVENOUS

## 2023-12-11 MED ORDER — MIDAZOLAM HCL 2 MG/2ML IJ SOLN
INTRAMUSCULAR | Status: DC | PRN
  Administered 2023-12-11: 2 mg via INTRAVENOUS

## 2023-12-11 MED ORDER — 0.9 % SODIUM CHLORIDE (POUR BTL) OPTIME
TOPICAL | Status: DC | PRN
  Administered 2023-12-11: 500 mL

## 2023-12-11 MED ORDER — BUPIVACAINE-EPINEPHRINE (PF) 0.5% -1:200000 IJ SOLN
INTRAMUSCULAR | Status: AC
  Filled 2023-12-11: qty 10

## 2023-12-11 MED ORDER — GABAPENTIN 300 MG PO CAPS
ORAL_CAPSULE | ORAL | Status: AC
  Filled 2023-12-11: qty 1

## 2023-12-11 MED ORDER — ONDANSETRON HCL 4 MG/2ML IJ SOLN
INTRAMUSCULAR | Status: AC
  Filled 2023-12-11: qty 2

## 2023-12-11 MED ORDER — MIDAZOLAM HCL 2 MG/2ML IJ SOLN
INTRAMUSCULAR | Status: AC
  Filled 2023-12-11: qty 2

## 2023-12-11 MED ORDER — HYDROMORPHONE HCL 1 MG/ML IJ SOLN
0.5000 mg | INTRAMUSCULAR | Status: DC | PRN
  Administered 2023-12-11: 0.5 mg via INTRAVENOUS

## 2023-12-11 MED ORDER — LACTATED RINGERS IV SOLN: INTRAVENOUS | Status: DC

## 2023-12-11 MED ORDER — PHENYLEPHRINE HCL-NACL 20-0.9 MG/250ML-% IV SOLN
INTRAVENOUS | Status: DC | PRN
  Administered 2023-12-11: 40 ug/min via INTRAVENOUS

## 2023-12-11 MED ORDER — PHENYLEPHRINE 80 MCG/ML (10ML) SYRINGE FOR IV PUSH (FOR BLOOD PRESSURE SUPPORT)
PREFILLED_SYRINGE | INTRAVENOUS | Status: DC | PRN
  Administered 2023-12-11 (×4): 80 ug via INTRAVENOUS

## 2023-12-11 MED ORDER — LIDOCAINE HCL (PF) 2 % IJ SOLN
INTRAMUSCULAR | Status: AC
  Filled 2023-12-11: qty 5

## 2023-12-11 MED ORDER — ROCURONIUM BROMIDE 100 MG/10ML IV SOLN
INTRAVENOUS | Status: DC | PRN
  Administered 2023-12-11: 10 mg via INTRAVENOUS
  Administered 2023-12-11: 60 mg via INTRAVENOUS
  Administered 2023-12-11: 10 mg via INTRAVENOUS

## 2023-12-11 MED ORDER — EPHEDRINE 5 MG/ML INJ
INTRAVENOUS | Status: AC
  Filled 2023-12-11: qty 5

## 2023-12-11 MED ORDER — IBUPROFEN 800 MG PO TABS: 800.0000 mg | ORAL_TABLET | Freq: Three times a day (TID) | ORAL | 0 refills | Status: AC | PRN

## 2023-12-11 MED ORDER — HYDROMORPHONE HCL 1 MG/ML IJ SOLN
INTRAMUSCULAR | Status: AC
  Filled 2023-12-11: qty 1

## 2023-12-11 MED ORDER — SUGAMMADEX SODIUM 200 MG/2ML IV SOLN
INTRAVENOUS | Status: DC | PRN
Start: 2023-12-11 — End: 2023-12-11
  Administered 2023-12-11: 180 mg via INTRAVENOUS

## 2023-12-11 MED ORDER — OXYCODONE HCL 5 MG/5ML PO SOLN: 5.0000 mg | Freq: Once | ORAL | Status: AC | PRN

## 2023-12-11 MED ORDER — LIDOCAINE HCL (CARDIAC) PF 100 MG/5ML IV SOSY
PREFILLED_SYRINGE | INTRAVENOUS | Status: DC | PRN
  Administered 2023-12-11: 100 mg via INTRAVENOUS

## 2023-12-11 MED ORDER — PROPOFOL 10 MG/ML IV BOLUS
INTRAVENOUS | Status: DC | PRN
  Administered 2023-12-11: 110 mg via INTRAVENOUS

## 2023-12-11 MED ORDER — OXYCODONE HCL 5 MG PO TABS
5.0000 mg | ORAL_TABLET | Freq: Once | ORAL | Status: AC | PRN
  Administered 2023-12-11: 5 mg via ORAL

## 2023-12-11 SURGICAL SUPPLY — 49 items
ANCHOR TIS RET SYS 235ML (MISCELLANEOUS) IMPLANT
COVER TIP SHEARS 8 DVNC (MISCELLANEOUS) ×1 IMPLANT
COVER WAND RF STERILE (DRAPES) ×1 IMPLANT
DERMABOND ADVANCED .7 DNX12 (GAUZE/BANDAGES/DRESSINGS) ×1 IMPLANT
DRAPE ARM DVNC X/XI (DISPOSABLE) ×3 IMPLANT
DRAPE COLUMN DVNC XI (DISPOSABLE) ×1 IMPLANT
ELECT REM PT RETURN 9FT ADLT (ELECTROSURGICAL) ×1 IMPLANT
ELECTRODE REM PT RTRN 9FT ADLT (ELECTROSURGICAL) ×1 IMPLANT
FORCEPS BPLR FENES DVNC XI (FORCEP) ×1 IMPLANT
GLOVE BIOGEL PI IND STRL 7.0 (GLOVE) ×2 IMPLANT
GLOVE SURG SYN 6.5 ES PF (GLOVE) ×3 IMPLANT
GLOVE SURG SYN 6.5 PF PI (GLOVE) ×2 IMPLANT
GOWN STRL REUS W/ TWL LRG LVL3 (GOWN DISPOSABLE) ×3 IMPLANT
GRASPER SUT TROCAR 14GX15 (MISCELLANEOUS) IMPLANT
GRASPER TIP-UP FEN DVNC XI (INSTRUMENTS) IMPLANT
IRRIGATOR SUCT 8 DISP DVNC XI (IRRIGATION / IRRIGATOR) IMPLANT
IV NS 1000ML BAXH (IV SOLUTION) IMPLANT
LABEL OR SOLS (LABEL) ×1 IMPLANT
MANIFOLD NEPTUNE II (INSTRUMENTS) ×1 IMPLANT
MESH VENTRALIGHT ST W/ECHO 6IN (Mesh General) IMPLANT
NDL DRIVE SUT CUT DVNC (INSTRUMENTS) ×1 IMPLANT
NDL HYPO 22X1.5 SAFETY MO (MISCELLANEOUS) ×1 IMPLANT
NDL INSUFFLATION 14GA 120MM (NEEDLE) ×1 IMPLANT
NEEDLE DRIVE SUT CUT DVNC (INSTRUMENTS) ×1 IMPLANT
NEEDLE HYPO 22X1.5 SAFETY MO (MISCELLANEOUS) ×1 IMPLANT
NEEDLE INSUFFLATION 14GA 120MM (NEEDLE) ×1 IMPLANT
OBTURATOR OPTICAL STND 8 DVNC (TROCAR) ×1 IMPLANT
OBTURATOR OPTICALSTD 8 DVNC (TROCAR) ×1 IMPLANT
PACK LAP CHOLECYSTECTOMY (MISCELLANEOUS) ×1 IMPLANT
SCISSORS MNPLR CVD DVNC XI (INSTRUMENTS) ×1 IMPLANT
SEAL UNIV 5-12 XI (MISCELLANEOUS) ×3 IMPLANT
SEALER VESSEL EXT DVNC XI (MISCELLANEOUS) IMPLANT
SET TUBE SMOKE EVAC HIGH FLOW (TUBING) ×1 IMPLANT
SOL ELECTROSURG ANTI STICK (MISCELLANEOUS) ×1 IMPLANT
SOLUTION ELECTROSURG ANTI STCK (MISCELLANEOUS) ×1 IMPLANT
SUT MNCRL 4-0 27 PS-2 XMFL (SUTURE) ×2 IMPLANT
SUT MNCRL AB 4-0 PS2 18 (SUTURE) ×1 IMPLANT
SUT STRATA 3-0 30 PS-1 (SUTURE) IMPLANT
SUT STRATAFIX 0 PDS+ CT-2 23 (SUTURE) ×2 IMPLANT
SUT VIC AB 0 CT1 36 (SUTURE) IMPLANT
SUT VIC AB 3-0 SH 27X BRD (SUTURE) IMPLANT
SUT VICRYL 0 UR6 27IN ABS (SUTURE) ×1 IMPLANT
SUTURE MNCRL 4-0 27XMF (SUTURE) IMPLANT
SUTURE STRATFX 0 PDS+ CT-2 23 (SUTURE) ×1 IMPLANT
SYR 30ML LL (SYRINGE) ×1 IMPLANT
SYSTEM WECK SHIELD CLOSURE (TROCAR) IMPLANT
TRAP FLUID SMOKE EVACUATOR (MISCELLANEOUS) ×1 IMPLANT
TRAY FOLEY MTR SLVR 16FR STAT (SET/KITS/TRAYS/PACK) ×1 IMPLANT
WATER STERILE IRR 500ML POUR (IV SOLUTION) ×1 IMPLANT

## 2023-12-11 NOTE — Transfer of Care (Signed)
 Immediate Anesthesia Transfer of Care Note  Patient: Sheila Parsons  Procedure(s) Performed: XI ROBOTIC ASSISTED VENTRAL HERNIA (Abdomen) INSERTION OF MESH (Abdomen)  Patient Location: PACU  Anesthesia Type:General  Level of Consciousness: drowsy  Airway & Oxygen Therapy: Patient Spontanous Breathing and Patient connected to face mask oxygen  Post-op Assessment: Report given to RN and Post -op Vital signs reviewed and stable  Post vital signs: Reviewed and stable  Last Vitals:  Vitals Value Taken Time  BP 123/65 12/11/23 1045  Temp 37.4 C 12/11/23 1045  Pulse 93 12/11/23 1048  Resp 20 12/11/23 1048  SpO2 100 % 12/11/23 1048  Vitals shown include unfiled device data.  Last Pain:  Vitals:   12/11/23 1045  TempSrc:   PainSc: Asleep         Complications: No notable events documented.

## 2023-12-11 NOTE — Anesthesia Procedure Notes (Signed)
 Procedure Name: Intubation Date/Time: 12/11/2023 7:34 AM  Performed by: Morene Crocker, CRNAPre-anesthesia Checklist: Patient identified, Patient being monitored, Timeout performed, Emergency Drugs available and Suction available Patient Re-evaluated:Patient Re-evaluated prior to induction Oxygen Delivery Method: Circle system utilized Preoxygenation: Pre-oxygenation with 100% oxygen Induction Type: IV induction Ventilation: Mask ventilation without difficulty Laryngoscope Size: 3 and McGrath Grade View: Grade I Tube type: Oral Tube size: 7.0 mm Number of attempts: 1 Airway Equipment and Method: Stylet Placement Confirmation: ETT inserted through vocal cords under direct vision, positive ETCO2 and breath sounds checked- equal and bilateral Secured at: 21 cm Tube secured with: Tape Dental Injury: Teeth and Oropharynx as per pre-operative assessment  Comments: Smooth atraumatic ETT placement, no complications noted.

## 2023-12-11 NOTE — Op Note (Signed)
 Preoperative diagnosis: Ventral, incarcerated hernia Postoperative diagnosis: same  Procedure: Robotic assisted laparoscopic ventral hernia repair with mesh  Anesthesia: general  Surgeon: Sung Amabile  Wound Classification: Clean  Specimen: none  Complications: None  Estimated Blood Loss: 10ml  Indications:see HPI  Findings: Ventral, incarcerated hernia, measuring 3.1 cm x 3.2 cm 4. Tension free repair achieved with 15 cm bard mesh and suture 5. Adequate hemostasis  Description of procedure: The patient was brought to the operating room and general anesthesia was induced. A time-out was completed verifying correct patient, procedure, site, positioning, and implant(s) and/or special equipment prior to beginning this procedure. Antibiotics were administered prior to making the incision. SCDs placed. The anterior abdominal wall was prepped and draped in the standard sterile fashion.   Palmer's point chosen for entry.  Veress needle placed and abdomen insufflated to 15cm without any dramatic increase in pressure.  Needle removed and optiview technique used to place 5mm port at same point.  No injury noted during placement.  3 additional ports, 8mm x2 and 12mm, along left lateral aspect placed.  Xi robot then docked into place.  Hernia contents including transverse colon and majority of omentum noted and reduced with combination of blunt, sharp dissection with scissors, vessel sealer and fenestrated forceps.  Hemostasis achieved throughout this portion.  Once all hernia contents reduced, there was noted to be a 3.1 x 3.2 cm hernia.  No injury was noted to the transverse colon during the reduction.  Excess hernia sac was then reduced as well and ligated to facilitate closure.  Insufflation dropped to 10mm and transfacial suture with 0 stratafix x 2 used to primarily close defect under minimal tension. Bard echo plus protected 15 cm mesh was placed within the abdominal cavity through 12mm port  and secured to the abdominal wall centered over the defect using the echo +2 system the mesh was then circumferentially sutured into the anterior abdominal wall using 3-0 VLock x2.  Any bleeding noted during this portion was no longer actively bleeding by end of securing mesh and tightening the suture.  Strands of omentum with inadequate blood supply was then further separated from healthy omentum to minimize risk of necrosis.  Excess omental contents as well as the hernia sac all placed in an Endo Catch bag through the 12 mm port site.  Robot was undocked.  The 12mm cannula was removed and port site extended to remove the Endo Catch bag.  The port site then  closed using Efx shield device with 0 vicryl suture, then additional 0 Vicryl suture under direct visualization ensuring no bowels were injured during this process.  Abdomen then desufflated while camera within abdomen to ensure no signs of new bleed prior to removing camera and rest of ports completely.  All skin incisions closed with runninrg 4-0 Monocryl in a subcuticular fashion.  All wounds then dressed with Dermabond.  Patient was then successfully awakened and transferred to PACU in stable condition.  At the end of the procedure sponge and instrument counts were correct.

## 2023-12-11 NOTE — Discharge Instructions (Signed)
 Hernia repair, Care After ?This sheet gives you information about how to care for yourself after your procedure. Your health care provider may also give you more specific instructions. If you have problems or questions, contact your health care provider. ?What can I expect after the procedure? ?After your procedure, it is common to have the following: ?Pain in your abdomen, especially in the incision areas. You will be given medicine to control the pain. ?Tiredness. This is a normal part of the recovery process. Your energy level will return to normal over the next several weeks. ?Changes in your bowel movements, such as constipation or needing to go more often. Talk with your health care provider about how to manage this. ?Follow these instructions at home: ?Medicines ? tylenol and advil as needed for discomfort.  Please alternate between the two every four hours as needed for pain.   ? Use narcotics, if prescribed, only when tylenol and motrin is not enough to control pain. ? 325-650mg  every 8hrs to max of 3000mg /24hrs (including the 325mg  in every norco dose) for the tylenol.   ? Advil up to 800mg  per dose every 8hrs as needed for pain.   ?PLEASE RECORD NUMBER OF PILLS TAKEN UNTIL NEXT FOLLOW UP APPT.  THIS WILL HELP DETERMINE HOW READY YOU ARE TO BE RELEASED FROM ANY ACTIVITY RESTRICTIONS ?Do not drive or use heavy machinery while taking prescription pain medicine. ?Do not drink alcohol while taking prescription pain medicine. ? ?Incision care ? ?  ?Follow instructions from your health care provider about how to take care of your incision areas. Make sure you: ?Keep your incisions clean and dry. ?Wash your hands with soap and water before and after applying medicine to the areas, and before and after changing your bandage (dressing). If soap and water are not available, use hand sanitizer. ?Change your dressing as told by your health care provider. ?Leave stitches (sutures), skin glue, or adhesive strips in  place. These skin closures may need to stay in place for 2 weeks or longer. If adhesive strip edges start to loosen and curl up, you may trim the loose edges. Do not remove adhesive strips completely unless your health care provider tells you to do that. ?Do not wear tight clothing over the incisions. Tight clothing may rub and irritate the incision areas, which may cause the incisions to open. ?Do not take baths, swim, or use a hot tub until your health care provider approves. OK TO SHOWER IN 24HRS.   ?Check your incision area every day for signs of infection. Check for: ?More redness, swelling, or pain. ?More fluid or blood. ?Warmth. ?Pus or a bad smell. ?Activity ?Avoid lifting anything that is heavier than 10 lb (4.5 kg) for 2 weeks or until your health care provider says it is okay. ?No pushing/pulling greater than 30lbs ?You may resume normal activities as told by your health care provider. Ask your health care provider what activities are safe for you. ?Take rest breaks during the day as needed. ?Eating and drinking ?Follow instructions from your health care provider about what you can eat after surgery. ?To prevent or treat constipation while you are taking prescription pain medicine, your health care provider may recommend that you: ?Drink enough fluid to keep your urine clear or pale yellow. ?Take over-the-counter or prescription medicines. ?Eat foods that are high in fiber, such as fresh fruits and vegetables, whole grains, and beans. ?Limit foods that are high in fat and processed sugars, such as fried and  sweet foods. ?General instructions ?Ask your health care provider when you will need an appointment to get your sutures or staples removed. ?Keep all follow-up visits as told by your health care provider. This is important. ?Contact a health care provider if: ?You have more redness, swelling, or pain around your incisions. ?You have more fluid or blood coming from the incisions. ?Your incisions feel  warm to the touch. ?You have pus or a bad smell coming from your incisions or your dressing. ?You have a fever. ?You have an incision that breaks open (edges not staying together) after sutures or staples have been removed. ?You develop a rash. ?You have chest pain or difficulty breathing. ?You have pain or swelling in your legs. ?You feel light-headed or you faint. ?Your abdomen swells (becomes distended). ?You have nausea or vomiting. ?You have blood in your stool (feces). ?This information is not intended to replace advice given to you by your health care provider. Make sure you discuss any questions you have with your health care provider. ?Document Released: 04/19/2005 Document Revised: 06/19/2018 Document Reviewed: 07/01/2016 ?Elsevier Interactive Patient Education ? 2019 [employer] ?  ? ?

## 2023-12-11 NOTE — Interval H&P Note (Signed)
 No change. Ok to proceed.  R/b/a discussed again and pt still wishes to proceed

## 2023-12-11 NOTE — Anesthesia Preprocedure Evaluation (Addendum)
 Anesthesia Evaluation  Patient identified by MRN, date of birth, ID band Patient awake    Reviewed: Allergy & Precautions, NPO status , Patient's Chart, lab work & pertinent test results  History of Anesthesia Complications Negative for: history of anesthetic complications  Airway Mallampati: II   Neck ROM: Full    Dental  (+) Missing, Chipped   Pulmonary neg pulmonary ROS   Pulmonary exam normal breath sounds clear to auscultation       Cardiovascular hypertension, Normal cardiovascular exam Rhythm:Regular Rate:Normal  ECG 12/08/23:  Normal sinus rhythm Low voltage QRS Cannot rule out Anterior infarct , age undetermined   Neuro/Psych negative neurological ROS     GI/Hepatic negative GI ROS,,,  Endo/Other  Obesity; prediabetes  Renal/GU negative Renal ROS     Musculoskeletal   Abdominal   Peds  Hematology negative hematology ROS (+)   Anesthesia Other Findings   Reproductive/Obstetrics                             Anesthesia Physical Anesthesia Plan  ASA: 2  Anesthesia Plan: General   Post-op Pain Management:    Induction: Intravenous  PONV Risk Score and Plan: 3 and Ondansetron, Dexamethasone and Treatment may vary due to age or medical condition  Airway Management Planned: Oral ETT  Additional Equipment:   Intra-op Plan:   Post-operative Plan: Extubation in OR  Informed Consent: I have reviewed the patients History and Physical, chart, labs and discussed the procedure including the risks, benefits and alternatives for the proposed anesthesia with the patient or authorized representative who has indicated his/her understanding and acceptance.     Dental advisory given  Plan Discussed with: CRNA  Anesthesia Plan Comments: (Patient consented for risks of anesthesia including but not limited to:  - adverse reactions to medications - damage to eyes, teeth, lips or other  oral mucosa - nerve damage due to positioning  - sore throat or hoarseness - damage to heart, brain, nerves, lungs, other parts of body or loss of life  Informed patient about role of CRNA in peri- and intra-operative care.  Patient voiced understanding.)        Anesthesia Quick Evaluation

## 2023-12-11 NOTE — Anesthesia Postprocedure Evaluation (Signed)
 Anesthesia Post Note  Patient: Programmer, applications  Procedure(s) Performed: XI ROBOTIC ASSISTED VENTRAL HERNIA (Abdomen) INSERTION OF MESH (Abdomen)  Patient location during evaluation: PACU Anesthesia Type: General Level of consciousness: awake and alert, oriented and patient cooperative Pain management: pain level controlled Vital Signs Assessment: post-procedure vital signs reviewed and stable Respiratory status: spontaneous breathing, nonlabored ventilation and respiratory function stable Cardiovascular status: blood pressure returned to baseline and stable Postop Assessment: adequate PO intake Anesthetic complications: no   No notable events documented.   Last Vitals:  Vitals:   12/11/23 1045 12/11/23 1100  BP: 123/65 120/65  Pulse: 94 89  Resp: (!) 22 18  Temp: 37.4 C   SpO2: 100% 100%    Last Pain:  Vitals:   12/11/23 1117  TempSrc:   PainSc: 10-Worst pain ever                 Reed Breech

## 2023-12-12 ENCOUNTER — Encounter: Payer: Self-pay | Admitting: Surgery

## 2023-12-12 LAB — SURGICAL PATHOLOGY

## 2024-02-25 DIAGNOSIS — R7309 Other abnormal glucose: Secondary | ICD-10-CM | POA: Diagnosis not present

## 2024-02-25 DIAGNOSIS — Z87898 Personal history of other specified conditions: Secondary | ICD-10-CM | POA: Diagnosis not present

## 2024-02-25 DIAGNOSIS — Z1231 Encounter for screening mammogram for malignant neoplasm of breast: Secondary | ICD-10-CM | POA: Diagnosis not present

## 2024-02-25 DIAGNOSIS — R1900 Intra-abdominal and pelvic swelling, mass and lump, unspecified site: Secondary | ICD-10-CM | POA: Diagnosis not present

## 2024-02-25 DIAGNOSIS — K439 Ventral hernia without obstruction or gangrene: Secondary | ICD-10-CM | POA: Diagnosis not present

## 2024-02-25 DIAGNOSIS — I10 Essential (primary) hypertension: Secondary | ICD-10-CM | POA: Diagnosis not present

## 2024-08-25 DIAGNOSIS — R7309 Other abnormal glucose: Secondary | ICD-10-CM | POA: Diagnosis not present

## 2024-08-25 DIAGNOSIS — Z87898 Personal history of other specified conditions: Secondary | ICD-10-CM | POA: Diagnosis not present

## 2024-08-25 DIAGNOSIS — I10 Essential (primary) hypertension: Secondary | ICD-10-CM | POA: Diagnosis not present
